# Patient Record
Sex: Female | Born: 1970 | Race: White | Hispanic: No | Marital: Married | Smoking: Never smoker
Health system: Southern US, Community
[De-identification: ages and names within clinical notes are randomized; demographics above are authoritative.]

## PROBLEM LIST (undated history)

## (undated) DIAGNOSIS — R011 Cardiac murmur, unspecified: Secondary | ICD-10-CM

## (undated) DIAGNOSIS — K219 Gastro-esophageal reflux disease without esophagitis: Secondary | ICD-10-CM

## (undated) DIAGNOSIS — F419 Anxiety disorder, unspecified: Secondary | ICD-10-CM

## (undated) DIAGNOSIS — I493 Ventricular premature depolarization: Secondary | ICD-10-CM

## (undated) DIAGNOSIS — E785 Hyperlipidemia, unspecified: Secondary | ICD-10-CM

## (undated) DIAGNOSIS — K635 Polyp of colon: Secondary | ICD-10-CM

## (undated) HISTORY — DX: Anxiety disorder, unspecified: F41.9

## (undated) HISTORY — DX: Ventricular premature depolarization: I49.3

## (undated) HISTORY — DX: Hyperlipidemia, unspecified: E78.5

## (undated) HISTORY — DX: Cardiac murmur, unspecified: R01.1

## (undated) HISTORY — PX: DILATION AND CURETTAGE OF UTERUS: SHX78

## (undated) HISTORY — DX: Gastro-esophageal reflux disease without esophagitis: K21.9

## (undated) HISTORY — DX: Polyp of colon: K63.5

---

## 1999-12-08 ENCOUNTER — Other Ambulatory Visit: Admission: RE | Admit: 1999-12-08 | Discharge: 1999-12-08 | Payer: Self-pay | Admitting: Gynecology

## 2001-01-06 ENCOUNTER — Other Ambulatory Visit: Admission: RE | Admit: 2001-01-06 | Discharge: 2001-01-06 | Payer: Self-pay | Admitting: Gynecology

## 2002-03-07 ENCOUNTER — Other Ambulatory Visit: Admission: RE | Admit: 2002-03-07 | Discharge: 2002-03-07 | Payer: Self-pay | Admitting: Gynecology

## 2002-03-14 ENCOUNTER — Ambulatory Visit (HOSPITAL_COMMUNITY): Admission: RE | Admit: 2002-03-14 | Discharge: 2002-03-14 | Payer: Self-pay | Admitting: Gynecology

## 2002-03-14 ENCOUNTER — Encounter (INDEPENDENT_AMBULATORY_CARE_PROVIDER_SITE_OTHER): Payer: Self-pay

## 2002-06-15 ENCOUNTER — Ambulatory Visit (HOSPITAL_COMMUNITY): Admission: RE | Admit: 2002-06-15 | Discharge: 2002-06-15 | Payer: Self-pay | Admitting: *Deleted

## 2002-06-15 ENCOUNTER — Encounter (INDEPENDENT_AMBULATORY_CARE_PROVIDER_SITE_OTHER): Payer: Self-pay

## 2003-03-01 ENCOUNTER — Inpatient Hospital Stay (HOSPITAL_COMMUNITY): Admission: AD | Admit: 2003-03-01 | Discharge: 2003-03-01 | Payer: Self-pay | Admitting: Gynecology

## 2003-03-01 ENCOUNTER — Encounter: Payer: Self-pay | Admitting: Gynecology

## 2003-03-13 ENCOUNTER — Other Ambulatory Visit: Admission: RE | Admit: 2003-03-13 | Discharge: 2003-03-13 | Payer: Self-pay | Admitting: Gynecology

## 2003-09-12 ENCOUNTER — Inpatient Hospital Stay (HOSPITAL_COMMUNITY): Admission: AD | Admit: 2003-09-12 | Discharge: 2003-09-16 | Payer: Self-pay | Admitting: Gynecology

## 2003-09-14 ENCOUNTER — Encounter (INDEPENDENT_AMBULATORY_CARE_PROVIDER_SITE_OTHER): Payer: Self-pay | Admitting: Specialist

## 2003-10-23 ENCOUNTER — Other Ambulatory Visit: Admission: RE | Admit: 2003-10-23 | Discharge: 2003-10-23 | Payer: Self-pay | Admitting: Gynecology

## 2005-01-26 ENCOUNTER — Other Ambulatory Visit: Admission: RE | Admit: 2005-01-26 | Discharge: 2005-01-26 | Payer: Self-pay | Admitting: Gynecology

## 2005-05-06 ENCOUNTER — Encounter: Admission: RE | Admit: 2005-05-06 | Discharge: 2005-05-06 | Payer: Self-pay | Admitting: Gynecology

## 2006-02-18 ENCOUNTER — Other Ambulatory Visit: Admission: RE | Admit: 2006-02-18 | Discharge: 2006-02-18 | Payer: Self-pay | Admitting: Gynecology

## 2007-08-24 ENCOUNTER — Inpatient Hospital Stay (HOSPITAL_COMMUNITY): Admission: AD | Admit: 2007-08-24 | Discharge: 2007-08-28 | Payer: Self-pay | Admitting: Obstetrics

## 2009-12-19 ENCOUNTER — Emergency Department (HOSPITAL_COMMUNITY): Admission: EM | Admit: 2009-12-19 | Discharge: 2009-12-19 | Payer: Self-pay | Admitting: Emergency Medicine

## 2011-04-09 NOTE — Op Note (Signed)
[facility]  Patient:    Patricia Hutchinson, Patricia Hutchinson Visit Number: [id number] MRN: [id number]          Service Type: DSU Location: [facility] Attending Physician:  Douglass Rivers Proc. Date: 06/15/02 Admit Date:  06/15/2002                             Operative Report  PREOPERATIVE DIAGNOSIS:       Inappropriately decrease of Beta HCG                               after spontaneous abortion, questionable                               retained products.  POSTOPERATIVE DIAGNOSIS:      Inappropriately decrease of Beta HCG                               after spontaneous abortion, questionable                               retained products.  OPERATION:                    D&C.  SURGEON:                      Douglass Rivers, M.D.  ASSISTANT:  ANESTHESIA:                   IV sedation with paracervical block.  ESTIMATED BLOOD LOSS:         Minimal.  FINDINGS:                     Uterus anteverted 8 cm, scan tissue with clot.  INDICATIONS:                  The patient is a 40 year old G2, P0 with brisk vaginal bleeding the day after menses.  Beta HCG was elevated at 36. Follow-up Beta HCGs failed to demonstrate return to 0 despite bleeding episode x3.  She is O positive.  COMPLICATIONS:                None.  PATHOLOGY:                    Uterine contents.  DESCRIPTION OF PROCEDURE:     The patient was taken to the operating room. IV sedation was induced.  The patient was placed in the dorsal lithotomy position, prepped and draped in the usual sterile fashion.  A bivalve speculum was placed in the vagina. The cervix was visualized, stabilized with single-tooth tenaculum and a paracervical block was placed with 10 cc of 0.25% Marcaine solution.  The uterus was sounded to 8.  It was gently dilated up to 23 [place] after which #7 suction and curette was advanced through the cervix toward the fundus.  Uterine contents were cleared.  A gentle curettage confirmed good  cry throughout including across the fundal region.  Instruments were removed.  The cervix was noted to be hemostatic.  The patient was given a shot of Methergine intraoperatively and transferred to the PACU in stable condition. Attending Physician:  Douglass Rivers DD:  06/15/02 TD:  06/18/02 Job: [id number]

## 2011-04-09 NOTE — Discharge Summary (Signed)
NAME:  Patricia Hutchinson, RALPHS                          ACCOUNT NO.:  [id number]   MEDICAL RECORD NO.:  [id number]                   PATIENT TYPE:  INP   LOCATION:  9128                                 FACILITY:  [facility]   PHYSICIAN:  Ivor Costa. Farrel Gobble, M.D.              DATE OF BIRTH:  [birth year]   DATE OF ADMISSION:  09/12/2003  DATE OF DISCHARGE:  09/16/2003                                 DISCHARGE SUMMARY   DISCHARGE DIAGNOSES:  1. Intrauterine pregnancy 41 weeks, delivered.  2. Polyhydramnios.  3. Post dates.  4. Status post forceps assisted vaginal delivery.  5. Mild shoulder dystocia.   HISTORY:  This is a 40 year old female gravida 3, para 0, aborta 2 with an  EDC of September 03, 2003.  Prenatal course complicated by ________ abortion;  therefore, the patient was on baby aspirin.  Also had a small hematoma of  the cervix which resolved.  On September 12, 2003 patient was admitted at 41  weeks for induction secondary to polyhydramnios and post dates.  The patient  was given Cervidil and Pitocin was begun on September 14, 2003.  On the p.m.  of September 13, 2003 patient's Pitocin was stopped, given dinner, repeat  Cervidil and Pitocin was begun on the following day on September 14, 2003.  Subsequently, on September 14, 2003 the patient underwent a forceps-assisted  vaginal delivery of a female, Apgars of 8 and 9, weight of 8 pounds 9 ounces.  There was a midline episiotomy which was repaired.  There was mild shoulder  dystocia which was corrected suprapubic with McRoberts in a single maneuver.  There was a medial lateral left laceration which was repaired.  The patient  was treated prophylactically with Cytotec for bleeding.  Postpartum, the  patient remained afebrile, voiding, stable condition.  She was discharged to  home in satisfactory condition on September 16, 2003.   ACCESSORY CLINICAL FINDINGS:  Laboratories:  On September 15, 2003 hemoglobin  was 11.8.  The patient is O+, rubella immune.   DISPOSITION:  The patient is discharged to home.  Return to office six  weeks.  Given prescription for Tylox p.r.n. pain #30.     Susa Loffler, P.A.                    Ivor Costa. Farrel Gobble, M.D.    TSG/MEDQ  D:  10/14/2003  T:  10/14/2003  Job:  [id number]

## 2011-04-09 NOTE — Op Note (Signed)
[facility]  Patient:    Patricia Hutchinson, Patricia Hutchinson Visit Number: [id number] MRN: [id number]          Service Type: DSU Location: [facility] Attending Physician:  Douglass Rivers Dictated by:   Douglass Rivers, M.D. Proc. Date: 03/14/02 Admit Date:  03/14/2002                             Operative Report  PREOPERATIVE DIAGNOSES:       Missed abortion at 8+ weeks.  POSTOPERATIVE DIAGNOSES:      Missed abortion at 8+ weeks.  PROCEDURE:                    First trimester dilatation and evacuation.  SURGEON:                      Douglass Rivers, M.D.  ANESTHESIA:                   MAC and a paracervical block.  FINDINGS:                     Anteverted uterus slightly enlarged.  PATHOLOGY:                    Uterine contents.  PROCEDURE:                    Patient was taken to the operating room and placed in the dorsal lithotomy position.  After IV sedation prepped and draped in the usual sterile fashion.  Bivalve speculum was placed in the vagina.  A bimanual examination was performed.  Orientation of the uterus confirmed. Bivalve speculum was placed in the vagina and cervix was visualized, stabilized with a single tooth tenaculum and a paracervical block was placed with 10 cc of 0.25% Marcaine solution after which the cervix was dilated gently to 25 [place] and a #8 suction curette was advanced through the cervix. The uterine contents were cleared with suction.  A gentle curetting confirmed good uterine cry throughout.  The instruments were removed from the vagina. The uterus was massaged.  The patient received a single shot of Methergine and was noted to be hemostatic.  She was extubated in the OR and transferred to the PACU in stable condition. Dictated by:   Douglass Rivers, M.D. Attending Physician:  Douglass Rivers DD:  03/14/02 TD:  03/14/02 Job: [id number]

## 2011-09-02 LAB — CBC
HCT: 36.4
Hemoglobin: 12.3
MCHC: 33.4
MCHC: 33.8
MCV: 81.8
Platelets: 180
RDW: 14.1 — ABNORMAL HIGH
RDW: 14.4 — ABNORMAL HIGH

## 2011-09-02 LAB — RPR: RPR Ser Ql: NONREACTIVE

## 2012-12-12 DIAGNOSIS — E559 Vitamin D deficiency, unspecified: Secondary | ICD-10-CM | POA: Insufficient documentation

## 2012-12-13 DIAGNOSIS — F411 Generalized anxiety disorder: Secondary | ICD-10-CM | POA: Insufficient documentation

## 2012-12-18 ENCOUNTER — Other Ambulatory Visit: Payer: Self-pay | Admitting: Family Medicine

## 2012-12-18 DIAGNOSIS — Z1231 Encounter for screening mammogram for malignant neoplasm of breast: Secondary | ICD-10-CM

## 2013-01-18 ENCOUNTER — Ambulatory Visit
Admission: RE | Admit: 2013-01-18 | Discharge: 2013-01-18 | Disposition: A | Discharge status: Home / Self Care | Payer: Private Health Insurance - Indemnity | Source: Ambulatory Visit | Attending: Family Medicine | Admitting: Family Medicine

## 2013-01-18 DIAGNOSIS — Z87898 Personal history of other specified conditions: Secondary | ICD-10-CM | POA: Insufficient documentation

## 2013-01-18 DIAGNOSIS — Z1231 Encounter for screening mammogram for malignant neoplasm of breast: Secondary | ICD-10-CM

## 2013-01-23 ENCOUNTER — Other Ambulatory Visit: Payer: Self-pay | Admitting: Family Medicine

## 2013-01-23 DIAGNOSIS — R928 Other abnormal and inconclusive findings on diagnostic imaging of breast: Secondary | ICD-10-CM

## 2013-02-01 ENCOUNTER — Ambulatory Visit
Admission: RE | Admit: 2013-02-01 | Discharge: 2013-02-01 | Disposition: A | Discharge status: Home / Self Care | Payer: Medicare HMO | Source: Ambulatory Visit | Attending: Family Medicine | Admitting: Family Medicine

## 2013-02-01 DIAGNOSIS — R928 Other abnormal and inconclusive findings on diagnostic imaging of breast: Secondary | ICD-10-CM

## 2013-07-27 ENCOUNTER — Other Ambulatory Visit: Payer: Self-pay | Admitting: Family Medicine

## 2013-07-27 DIAGNOSIS — R921 Mammographic calcification found on diagnostic imaging of breast: Secondary | ICD-10-CM

## 2013-08-16 ENCOUNTER — Ambulatory Visit
Admission: RE | Admit: 2013-08-16 | Discharge: 2013-08-16 | Disposition: A | Discharge status: Home / Self Care | Payer: BC Managed Care – PPO | Source: Ambulatory Visit | Attending: Family Medicine | Admitting: Family Medicine

## 2013-08-16 DIAGNOSIS — R921 Mammographic calcification found on diagnostic imaging of breast: Secondary | ICD-10-CM

## 2014-01-24 ENCOUNTER — Other Ambulatory Visit: Payer: Self-pay | Admitting: Family Medicine

## 2014-01-24 DIAGNOSIS — R921 Mammographic calcification found on diagnostic imaging of breast: Secondary | ICD-10-CM

## 2014-02-07 ENCOUNTER — Ambulatory Visit
Admission: RE | Admit: 2014-02-07 | Discharge: 2014-02-07 | Disposition: A | Discharge status: Home / Self Care | Payer: BC Managed Care – PPO | Source: Ambulatory Visit | Attending: Family Medicine | Admitting: Family Medicine

## 2014-02-07 DIAGNOSIS — R921 Mammographic calcification found on diagnostic imaging of breast: Secondary | ICD-10-CM

## 2014-11-26 DIAGNOSIS — K219 Gastro-esophageal reflux disease without esophagitis: Secondary | ICD-10-CM | POA: Insufficient documentation

## 2014-12-26 ENCOUNTER — Other Ambulatory Visit (INDEPENDENT_AMBULATORY_CARE_PROVIDER_SITE_OTHER): Payer: 59

## 2014-12-26 ENCOUNTER — Ambulatory Visit (INDEPENDENT_AMBULATORY_CARE_PROVIDER_SITE_OTHER): Payer: 59 | Admitting: Gastroenterology

## 2014-12-26 ENCOUNTER — Encounter: Payer: Self-pay | Admitting: Gastroenterology

## 2014-12-26 VITALS — BP 100/60 | HR 72 | Ht 63.5 in | Wt 138.8 lb

## 2014-12-26 DIAGNOSIS — R140 Abdominal distension (gaseous): Secondary | ICD-10-CM

## 2014-12-26 DIAGNOSIS — R110 Nausea: Secondary | ICD-10-CM

## 2014-12-26 DIAGNOSIS — R194 Change in bowel habit: Secondary | ICD-10-CM

## 2014-12-26 DIAGNOSIS — R6881 Early satiety: Secondary | ICD-10-CM | POA: Insufficient documentation

## 2014-12-26 LAB — COMPREHENSIVE METABOLIC PANEL
ALT: 9 U/L (ref 0–35)
AST: 12 U/L (ref 0–37)
Albumin: 4.6 g/dL (ref 3.5–5.2)
Alkaline Phosphatase: 40 U/L (ref 39–117)
BILIRUBIN TOTAL: 0.4 mg/dL (ref 0.2–1.2)
BUN: 7 mg/dL (ref 6–23)
CHLORIDE: 103 meq/L (ref 96–112)
CO2: 28 mEq/L (ref 19–32)
Calcium: 10 mg/dL (ref 8.4–10.5)
Creatinine, Ser: 0.76 mg/dL (ref 0.40–1.20)
GFR: 88.72 mL/min (ref >60.00)
GLUCOSE: 95 mg/dL (ref 70–99)
POTASSIUM: 4.5 meq/L (ref 3.5–5.1)
Sodium: 139 mEq/L (ref 135–145)
Total Protein: 7.4 g/dL (ref 6.0–8.3)

## 2014-12-26 LAB — CBC WITH DIFFERENTIAL/PLATELET
BASOS PCT: 0.4 % (ref 0.0–3.0)
Basophils Absolute: 0 10*3/uL (ref 0.0–0.1)
EOS PCT: 0.9 % (ref 0.0–5.0)
Eosinophils Absolute: 0 10*3/uL (ref 0.0–0.7)
HEMATOCRIT: 40.8 % (ref 36.0–46.0)
HEMOGLOBIN: 13.6 g/dL (ref 12.0–15.0)
LYMPHS ABS: 1 10*3/uL (ref 0.7–4.0)
LYMPHS PCT: 25.1 % (ref 12.0–46.0)
MCHC: 33.2 g/dL (ref 30.0–36.0)
MCV: 78.6 fl (ref 78.0–100.0)
MONO ABS: 0.4 10*3/uL (ref 0.1–1.0)
Monocytes Relative: 9.3 % (ref 3.0–12.0)
Neutro Abs: 2.7 10*3/uL (ref 1.4–7.7)
Neutrophils Relative %: 64.3 % (ref 43.0–77.0)
Platelets: 177 10*3/uL (ref 150.0–400.0)
RBC: 5.2 Mil/uL — ABNORMAL HIGH (ref 3.87–5.11)
RDW: 13.3 % (ref 11.5–15.5)
WBC: 4.2 10*3/uL (ref 4.0–10.5)

## 2014-12-26 LAB — IGA: IgA: 315 mg/dL (ref 68–378)

## 2014-12-26 LAB — TSH: TSH: 0.68 u[IU]/mL (ref 0.35–4.50)

## 2014-12-26 MED ORDER — MOVIPREP 100 G PO SOLR: 1.0000 | Freq: Once | ORAL | Status: DC

## 2014-12-26 NOTE — Progress Notes (Signed)
12/26/2014 Patricia Hutchinson [id number] Aug 18, 1971   HISTORY OF PRESENT ILLNESS:  This is a very pleasant 44 year old female who is new to our practice and was referred her by her PCP's office, Dr. Coletta Memos.  She comes in today with several GI complaints.  She says that all of her symptoms started about 7 weeks ago and prior to that she could eat anything without any problems.  Now she complains of constantly feeling bloated, constant belching, and feel full very quickly when eating.  She also complains of a change in bowel habits with loose stools; says that she has not had a solid bowel movement in the past 7 weeks.  Describes her stools as looking like "sand" and "oatmeal".  Has 1-2 BM's per day.  Denies rectal bleeding.  Has lower abdominal tenderness and "tinges" of pain that come and go, but seem worse when she is walking, etc.  She has not undergone any evaluation by her PCP except for Hpylori serologies, which were negative.  She also recently had an ECHO, which was normal, because she was having PVC's which were symptomatic and then triggering her anxiety and possibly worsening some of her GI symptoms including the belching.  She has tried several different OTC regimens for her symptoms including Phazyme, Gas-X, OTC Nexium, OTC prilosec, fiber capsules, digestive enzymes, and 7 day "high potency" probiotics with 200 billion bacteria (just of note, she said that she has a friend who is really into probiotics and she does not trust any that do not have to be refrigerated).  She lost her son 5 years ago and since then she has dealt with a lot of anxiety and panic attacks.  She takes clonazepam prn, but does not like to use it unless absolutely necessary.  She says that she has never experienced GI issues with her anxiety in the past and prior to 7 weeks ago she did not have any GI issues.   Past Medical History  Diagnosis Date  . Anxiety   . PVCs (premature ventricular contractions)   . GERD  (gastroesophageal reflux disease)   . Hyperlipidemia 34    borderline  . Heart murmur    Past Surgical History  Procedure Laterality Date  . Dilation and curettage of uterus      x 2    reports that she has never smoked. She has never used smokeless tobacco. She reports that she drinks alcohol. She reports that she does not use illicit drugs. family history includes Breast cancer in her maternal grandmother and another family member; Colon polyps in her father; Diabetes in her father. There is no history of Colon cancer. No Known Allergies    Outpatient Encounter Prescriptions as of 12/26/2014  Medication Sig  . clonazePAM (KLONOPIN) 0.5 MG tablet Take 0.5 mg by mouth as needed for anxiety.  Marland Kitchen OVER THE COUNTER MEDICATION Psyllium fiber laxative capsules 2-4 daily  . OVER THE COUNTER MEDICATION Essential enzymes- one a day  . Probiotic Product (PROBIOTIC DAILY PO) Take by mouth. Ultimate flora probiotic , 7 day treatment , one a day  . VITAMIN D, CHOLECALCIFEROL, PO Take by mouth. 1 drop daily , 2000iu  . MOVIPREP 100 G SOLR Take 1 kit (200 g total) by mouth once.     REVIEW OF SYSTEMS  : All other systems reviewed and negative except where noted in the History of Present Illness.   PHYSICAL EXAM: BP 100/60 mmHg  Pulse 72  Ht 5'  3.5" (1.613 m)  Wt 138 lb 12.8 oz (62.959 kg)  BMI 24.20 kg/m2  LMP 12/16/2014 General: Well developed white female in no acute distress; somewhat anxious Head: Normocephalic and atraumatic Eyes:  Sclerae anicteric, conjunctiva pink. Ears: Normal auditory acuity Lungs: Clear throughout to auscultation Heart: Regular rate and rhythm Abdomen: Soft, non-distended.  Somewhat hyperactive bowel sounds.  Mild diffuse TTP. Musculoskeletal: Symmetrical with no gross deformities  Skin: No lesions on visible extremities Extremities: No edema  Neurological: Alert oriented x 4, grossly non-focal Psychological:  Alert and cooperative. Normal mood and affect.   Somewhat anxious.  ASSESSMENT AND PLAN: -44 year old female with multiple GI complaints including bloating, belching, nausea, early satiety, abdominal pains, and change in bowel habits with loose stools.  She does have underlying anxiety with occasional panic attacks, which do not sound like they are the best controlled.  I would not be surprised if her GI evaluation is all negative and she has IBS/aerophagia, but she says that she's never had GI issues in the past with her anxiety.  Will proceed with some evaluation including labs:  CBC, CMP, TSH, and celiac labs.  We will schedule EGD and colonoscopy as well to rule out any other pathologic causes of her multiple complaints.  Gallbladder issues could also be a consideration.  The risks, benefits, and alternatives were discussed with the patient and she consents to proceed.  She needs to discuss with her PCP a different regimen/treatment plan for her anxiety.  She can continue taking probiotics and I have recommended either Florastor or Restora for now (could consider VSL, however).  She can continue fiber supplements daily and Gas-X or Phayzyme prn.  She has already failed and as been unsuccessful with some IBS treatments so we will await results of the above studies for now before attempting other regimens.

## 2014-12-26 NOTE — Patient Instructions (Addendum)
You have been scheduled for an endoscopy and colonoscopy. Please follow the written instructions given to you at your visit today. Please pick up your prep at the pharmacy within the next 1-3 days. If you use inhalers (even only as needed), please bring them with you on the day of your procedure. Your physician has requested that you go to www.startemmi.com and enter the access code given to you at your visit today. This web site gives a general overview about your procedure. However, you should still follow specific instructions given to you by our office regarding your preparation for the procedure.  Go to the basement for labs today  CC:  Bernerd Limbo MD

## 2014-12-27 LAB — TISSUE TRANSGLUTAMINASE, IGA: TISSUE TRANSGLUTAMINASE AB, IGA: 1 U/mL (ref <4)

## 2014-12-27 NOTE — Progress Notes (Signed)
Agree with initial assessment and plans 

## 2015-01-07 ENCOUNTER — Other Ambulatory Visit: Payer: Self-pay | Admitting: *Deleted

## 2015-01-13 ENCOUNTER — Ambulatory Visit (AMBULATORY_SURGERY_CENTER): Payer: 59 | Admitting: Internal Medicine

## 2015-01-13 ENCOUNTER — Encounter: Payer: Self-pay | Admitting: Internal Medicine

## 2015-01-13 VITALS — BP 119/69 | HR 81 | Temp 99.1°F | Resp 15 | Ht 63.0 in | Wt 138.0 lb

## 2015-01-13 DIAGNOSIS — D123 Benign neoplasm of transverse colon: Secondary | ICD-10-CM | POA: Diagnosis not present

## 2015-01-13 DIAGNOSIS — R1010 Upper abdominal pain, unspecified: Secondary | ICD-10-CM

## 2015-01-13 DIAGNOSIS — R194 Change in bowel habit: Secondary | ICD-10-CM | POA: Diagnosis not present

## 2015-01-13 DIAGNOSIS — K3189 Other diseases of stomach and duodenum: Secondary | ICD-10-CM | POA: Diagnosis not present

## 2015-01-13 DIAGNOSIS — R140 Abdominal distension (gaseous): Secondary | ICD-10-CM

## 2015-01-13 DIAGNOSIS — R6881 Early satiety: Secondary | ICD-10-CM

## 2015-01-13 DIAGNOSIS — K2950 Unspecified chronic gastritis without bleeding: Secondary | ICD-10-CM | POA: Diagnosis not present

## 2015-01-13 DIAGNOSIS — R110 Nausea: Secondary | ICD-10-CM | POA: Diagnosis not present

## 2015-01-13 MED ORDER — SODIUM CHLORIDE 0.9 % IV SOLN: 500.0000 mL | INTRAVENOUS | Status: DC

## 2015-01-13 NOTE — Patient Instructions (Addendum)
YOU HAD AN ENDOSCOPIC PROCEDURE TODAY AT THE [facility]: Refer to the procedure report that was given to you for any specific questions about what was found during the examination.  If the procedure report does not answer your questions, please call your gastroenterologist to clarify.  If you requested that your care partner not be given the details of your procedure findings, then the procedure report has been included in a sealed envelope for you to review at your convenience later.  YOU SHOULD EXPECT: Some feelings of bloating in the abdomen. Passage of more gas than usual.  Walking can help get rid of the air that was put into your GI tract during the procedure and reduce the bloating. If you had a lower endoscopy (such as a colonoscopy or flexible sigmoidoscopy) you may notice spotting of blood in your stool or on the toilet paper. If you underwent a bowel prep for your procedure, then you may not have a normal bowel movement for a few days.  DIET: Your first meal following the procedure should be a light meal and then it is ok to progress to your normal diet.  A half-sandwich or bowl of soup is an example of a good first meal.  Heavy or fried foods are harder to digest and may make you feel nauseous or bloated.  Likewise meals heavy in dairy and vegetables can cause extra gas to form and this can also increase the bloating.  Drink plenty of fluids but you should avoid alcoholic beverages for 24 hours.  ACTIVITY: Your care partner should take you home directly after the procedure.  You should plan to take it easy, moving slowly for the rest of the day.  You can resume normal activity the day after the procedure however you should NOT DRIVE or use heavy machinery for 24 hours (because of the sedation medicines used during the test).    SYMPTOMS TO REPORT IMMEDIATELY: A gastroenterologist can be reached at any hour.  During normal business hours, 8:30 AM to 5:00 PM Monday through Friday,  call [phone].  After hours and on weekends, please call the GI answering service at [phone] who will take a message and have the physician on call contact you.   Following lower endoscopy (colonoscopy or flexible sigmoidoscopy):  Excessive amounts of blood in the stool  Significant tenderness or worsening of abdominal pains  Swelling of the abdomen that is new, acute  Fever of 100F or higher  Following upper endoscopy (EGD)  Vomiting of blood or coffee ground material  New chest pain or pain under the shoulder blades  Painful or persistently difficult swallowing  New shortness of breath  Fever of 100F or higher  Black, tarry-looking stools  FOLLOW UP: If any biopsies were taken you will be contacted by phone or by letter within the next 1-3 weeks.  Call your gastroenterologist if you have not heard about the biopsies in 3 weeks.  Our staff will call the home number listed on your records the next business day following your procedure to check on you and address any questions or concerns that you may have at that time regarding the information given to you following your procedure. This is a courtesy call and so if there is no answer at the home number and we have not heard from you through the emergency physician on call, we will assume that you have returned to your regular daily activities without incident.  SIGNATURES/CONFIDENTIALITY: You and/or your care   partner have signed paperwork which will be entered into your electronic medical record.  These signatures attest to the fact that that the information above on your After Visit Summary has been reviewed and is understood.  Full responsibility of the confidentiality of this discharge information lies with you and/or your care-partner.  Polyp-handout given  prilosec 20 mg over the counter for the next 2 weeks.  Call office in the next 2-3 days to schedule appointment with Dr Henrene Pastor for about 6 weeks.

## 2015-01-13 NOTE — Progress Notes (Signed)
Report to PACU, RN, vss, BBS= Clear.  

## 2015-01-13 NOTE — Op Note (Signed)
[place]  [address] [place], [address]   ENDOSCOPY PROCEDURE REPORT  PATIENT: Patricia Hutchinson, Patricia Hutchinson  MR#: [id number] BIRTHDATE: 28-Apr-1971 , 44  yrs. old GENDER: female ENDOSCOPIST: Eustace Quail, MD REFERRED BY:  Bernerd Limbo, M.D. PROCEDURE DATE:  01/13/2015 PROCEDURE:  EGD w/ biopsy ASA CLASS:     Class I INDICATIONS:  abdominal pain/bloating, or satiety, weight loss. MEDICATIONS: Monitored anesthesia care and Propofol 130 mg IV TOPICAL ANESTHETIC: none  DESCRIPTION OF PROCEDURE: After the risks benefits and alternatives of the procedure were thoroughly explained, informed consent was obtained.  The LB [id number] endoscope was introduced through the mouth and advanced to the second portion of the duodenum , Without limitations.  The instrument was slowly withdrawn as the mucosa was fully examined.    The esophagus was normal throughout.  Just below the mucosal Z line was a 1 cm inflammatory appearing nodule which was biopsied.. Stomach was otherwise normal.  The duodenum was normal. Retroflexed views revealed no abnormalities.     The scope was then withdrawn from the patient and the procedure completed.  COMPLICATIONS: There were no immediate complications.  ENDOSCOPIC IMPRESSION: 1. Small benign inflammatory appearing nodule at the gastroesophageal junction status post biopsies 2. Otherwise normal EGD 3. 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 Has had some improvement since office visit  RECOMMENDATIONS: 1.  Prilosec OTC 20 mg daily for 2 weeks 2.  Await biopsy results. Dr. Henrene Pastor will send you a letter in a few weeks 3.  Okay to liberalize diet as we discussed 4. Call office next 2-3 days to schedule an office appointment, with Dr. Henrene Pastor, for about 6 weeks  REPEAT EXAM:  eSigned:  Eustace Quail, MD 01/13/2015 4:25 PM    BU:LAGTX Coletta Memos, MD and The Patient

## 2015-01-13 NOTE — Op Note (Signed)
[place]  [address] [place], [address]   COLONOSCOPY PROCEDURE REPORT  PATIENT: Patricia, Hutchinson  MR#: [id number] BIRTHDATE: 10/11/1971 , 44  yrs. old GENDER: female ENDOSCOPIST: Eustace Quail, MD REFERRED OE:HOZYY Bouska, M.D. PROCEDURE DATE:  01/13/2015 PROCEDURE:   Colonoscopy with snare polypectomy x 1 First Screening Colonoscopy - Avg.  risk and is 50 yrs.  old or older - No.  Prior Negative Screening - Now for repeat screening. N/A  History of Adenoma - Now for follow-up colonoscopy & has been > or = to 3 yrs.  N/A  Polyps Removed Today? Yes. ASA CLASS:   Class I INDICATIONS:change in bowel habits and abdominal pain / bloating. Weight loss. MEDICATIONS: Monitored anesthesia care and Propofol 350 mg IV  DESCRIPTION OF PROCEDURE:   After the risks benefits and alternatives of the procedure were thoroughly explained, informed consent was obtained.  The digital rectal exam revealed no abnormalities of the rectum.   The LB [id number]  endoscope was introduced through the anus and advanced to the cecum, which was identified by both the appendix and ileocecal valve. No adverse events experienced.   The quality of the prep was excellent, using MoviPrep  The instrument was then slowly withdrawn as the colon was fully examined.    COLON FINDINGS: The examined terminal ileum appeared to be normal. A sessile polyp measuring 5 mm in size was found in the transverse colon.  A polypectomy was performed with a cold snare.  The resection was complete, the polyp tissue was completely retrieved and sent to histology.   The examination was otherwise normal. Retroflexed views revealed no abnormalities. The time to cecum=3 minutes 07 seconds.  Withdrawal time=9 minutes 25 seconds.  The scope was withdrawn and the procedure completed. COMPLICATIONS: There were no immediate complications.  ENDOSCOPIC IMPRESSION: 1.   The examined terminal ileum appeared to be  normal 2.   Sessile polyp measuring 5 mm in size was found in the transverse colon; polypectomy was performed with a cold snare 3.   The examination was otherwise normal  RECOMMENDATIONS: 1.  Repeat colonoscopy in 5 years if polyp adenomatous; otherwise 10 years 2.  Upper endoscopy performed today (please see results)  eSigned:  Eustace Quail, MD 01/13/2015 4:17 PM   cc: Bernerd Limbo, MD and The Patient

## 2015-01-13 NOTE — Progress Notes (Signed)
Called to room to assist during endoscopic procedure.  Patient ID and intended procedure confirmed with present staff. Received instructions for my participation in the procedure from the performing physician.  

## 2015-01-14 ENCOUNTER — Telehealth: Payer: Self-pay

## 2015-01-14 NOTE — Telephone Encounter (Signed)
  Follow up Call-  Call back number 01/13/2015  Post procedure Call Back phone  # [phone]  Permission to leave phone message Yes     Patient questions:  Do you have a fever, pain , or abdominal swelling? No. Pain Score  0 *  Have you tolerated food without any problems? Yes.    Have you been able to return to your normal activities? Yes.    Do you have any questions about your discharge instructions: Diet   No. Medications  No. Follow up visit  No.  Do you have questions or concerns about your Care? No.  Actions: * If pain score is 4 or above: No action needed, pain <4.

## 2015-01-17 ENCOUNTER — Other Ambulatory Visit: Payer: Self-pay | Admitting: Family

## 2015-01-17 DIAGNOSIS — R921 Mammographic calcification found on diagnostic imaging of breast: Secondary | ICD-10-CM

## 2015-01-20 ENCOUNTER — Telehealth: Payer: Self-pay | Admitting: Internal Medicine

## 2015-01-20 NOTE — Telephone Encounter (Signed)
Pt states she cannot take Prilosec, states that it made her feel jittery. Pt states she doesn't think she can take PPI's. Discussed with pt that she might want to try zantac and see if she can take that. Pt will call back if she has problems with the zantac.

## 2015-01-21 ENCOUNTER — Encounter: Payer: Self-pay | Admitting: Internal Medicine

## 2015-02-24 DIAGNOSIS — K635 Polyp of colon: Secondary | ICD-10-CM | POA: Insufficient documentation

## 2015-02-25 ENCOUNTER — Ambulatory Visit: Payer: 59 | Admitting: Internal Medicine

## 2015-02-25 ENCOUNTER — Ambulatory Visit
Admission: RE | Admit: 2015-02-25 | Discharge: 2015-02-25 | Disposition: A | Discharge status: Home / Self Care | Payer: 59 | Source: Ambulatory Visit | Attending: Family | Admitting: Family

## 2015-02-25 ENCOUNTER — Other Ambulatory Visit: Payer: Self-pay | Admitting: Family

## 2015-02-25 DIAGNOSIS — R921 Mammographic calcification found on diagnostic imaging of breast: Secondary | ICD-10-CM

## 2015-06-10 ENCOUNTER — Ambulatory Visit (INDEPENDENT_AMBULATORY_CARE_PROVIDER_SITE_OTHER): Payer: 59 | Admitting: Urgent Care

## 2015-06-10 VITALS — BP 120/70 | HR 88 | Temp 99.0°F | Resp 16 | Ht 64.0 in | Wt 148.2 lb

## 2015-06-10 DIAGNOSIS — I809 Phlebitis and thrombophlebitis of unspecified site: Secondary | ICD-10-CM

## 2015-06-10 DIAGNOSIS — R202 Paresthesia of skin: Secondary | ICD-10-CM | POA: Diagnosis not present

## 2015-06-10 DIAGNOSIS — R200 Anesthesia of skin: Secondary | ICD-10-CM | POA: Insufficient documentation

## 2015-06-10 NOTE — Patient Instructions (Signed)
Phlebitis Phlebitis is soreness and swelling (inflammation) of a vein. This can occur in your arms, legs, or torso (trunk), as well as deeper inside your body. Phlebitis is usually not serious when it occurs close to the surface of the body. However, it can cause serious problems when it occurs in a vein deeper inside the body. CAUSES  Phlebitis can be triggered by various things, including:   Reduced blood flow through your veins. This can happen with:  Bed rest over a long period.  Long-distance travel.  Injury.  Surgery.  Being overweight (obese) or pregnant.  Having an IV tube put in the vein and getting certain medicines through the vein.  Cancer and cancer treatment.  Use of illegal drugs taken through the vein.  Inflammatory diseases.  Inherited (genetic) diseases that increase the risk of blood clots.  Hormone therapy, such as birth control pills. SIGNS AND SYMPTOMS   Red, tender, swollen, and painful area on your skin. Usually, the area will be long and narrow.  Firmness along the center of the affected area. This can indicate that a blood clot has formed.  Low-grade fever. DIAGNOSIS  A health care provider can usually diagnose phlebitis by examining the affected area and asking about your symptoms. To check for infection or blood clots, your health care provider may order blood tests or an ultrasound exam of the area. Blood tests and your family history may also indicate if you have an underlying genetic disease that causes blood clots. Occasionally, a piece of tissue is taken from the body (biopsy sample) if an unusual cause of phlebitis is suspected. TREATMENT  Treatment will vary depending on the severity of the condition and the area of the body affected. Treatment may include:  Use of a warm compress or heating pad.  Use of compression stockings or bandages.  Anti-inflammatory medicines.  Removal of any IV tube that may be causing the problem.  Medicines  that kill germs (antibiotics) if an infection is present.  Blood-thinning medicines if a blood clot is suspected or present.  In rare cases, surgery may be needed to remove damaged sections of vein. HOME CARE INSTRUCTIONS   Only take over-the-counter or prescription medicines as directed by your health care provider. Take all medicines exactly as prescribed.  Raise (elevate) the affected area above the level of your heart as directed by your health care provider.  Apply a warm compress or heating pad to the affected area as directed by your health care provider. Do not sleep with the heating pad.  Use compression stockings or bandages as directed. These will speed healing and prevent the condition from coming back.  If you are on blood thinners:  Get follow-up blood tests as directed by your health care provider.  Check with your health care provider before using any new medicines.  Carry a medical alert card or wear your medical alert jewelry to show that you are on blood thinners.  For phlebitis in the legs:  Avoid prolonged standing or bed rest.  Keep your legs moving. Raise your legs when sitting or lying.  Do not smoke.  Women, particularly those over the age of 35, should consider the risks and benefits of taking the contraceptive pill. This kind of hormone treatment can increase your risk for blood clots.  Follow up with your health care provider as directed. SEEK MEDICAL CARE IF:   You have unusual bruising or any bleeding problems.  Your swelling or pain in the affected area   is not improving. °· You are on anti-inflammatory medicine, and you develop belly (abdominal) pain. °SEEK IMMEDIATE MEDICAL CARE IF:  °· You have a sudden onset of chest pain or difficulty breathing. °· You have a fever or persistent symptoms for more than 2-3 days. °· You have a fever and your symptoms suddenly get worse. °MAKE SURE YOU: °· Understand these instructions. °· Will watch your  condition. °· Will get help right away if you are not doing well or get worse. °Document Released: 11/02/2001 Document Revised: 08/29/2013 Document Reviewed: 07/16/2013 °Exit[employer]® Patient Information ©2015 [employer]. This information is not intended to replace advice given to you by your health care provider. Make sure you discuss any questions you have with your health care provider. ° °

## 2015-06-10 NOTE — Progress Notes (Signed)
    MRN: [id number] DOB: 04/22/1971  Subjective:   Patricia Hutchinson is a 44 y.o. female presenting for chief complaint of Leg Injury  Reports ~1 week history of numbness and tingling of her left leg. 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 Reports that she randomly felt some numbness and tingling after she stood up and noticed that she also has some bruises of her left leg. Reports a history of breathing easily but cannot recall any specific trauma. Denies history of DVT, bleeding easily, bleeding gums. Denies history of clotting disorder. Denies fevers, lower leg swelling, redness, pain, weakness. Denies using oral contraception or smoking. Denies any recent surgeries, extended time traveling in the car or plane. No history of fracture of her lower leg. Denies any other aggravating or relieving factors, no other questions or concerns.  Patricia Hutchinson has a current medication list which includes the following prescription(s): clonazepam, probiotic product, cholecalciferol, OVER THE COUNTER MEDICATION, and OVER THE COUNTER MEDICATION. She has No Known Allergies.  Patricia Hutchinson  has a past medical history of Anxiety; PVCs (premature ventricular contractions); GERD (gastroesophageal reflux disease); Hyperlipidemia (34); Heart murmur; and Colon polyp. Also  has past surgical history that includes Dilation and curettage of uterus.  ROS As in subjective.  Objective:   Vitals: BP 120/70 mmHg  Pulse 88  Temp(Src) 99 F (37.2 C) (Oral)  Resp 16  Ht 5\' 4"  (1.626 m)  Wt 148 lb 3.2 oz (67.223 kg)  BMI 25.43 kg/m2  SpO2 98%  LMP 05/27/2015  Physical Exam  Constitutional: She is oriented to person, place, and time. She appears well-developed and well-nourished.  Cardiovascular: Normal rate, regular rhythm and intact distal pulses.  Exam reveals no gallop and no friction rub.   Murmur (low grade (II/VI) systolic murmur, NOT NEW) heard. Pulmonary/Chest: No respiratory distress. She has no wheezes.  She has no rales.  Musculoskeletal:       Left knee: She exhibits ecchymosis. She exhibits normal range of motion, no swelling, no effusion, no deformity, no laceration, no erythema and no bony tenderness. No tenderness found.       Legs: Full ROM, Strength 5/5. Negative Homans sign.  Neurological: She is alert and oriented to person, place, and time. She has normal reflexes.  Skin: Skin is warm and dry. No rash noted. No erythema. No pallor.   Assessment and Plan :   1. Thrombophlebitis 2. Numbness and tingling of left leg - Patient likely undergoing thrombophlebitis due to unperceived trauma. Numbness and tingling is very focal over areas of bruising likely related to thrombophlebitis. Low risk factors for DVT, advised patient take ibuprofen as needed. RTC in 4 weeks if numbness and tingling persists.  Jaynee Eagles, PA-C [facility] [phone] 06/10/2015 1:07 PM

## 2015-12-24 DIAGNOSIS — J312 Chronic pharyngitis: Secondary | ICD-10-CM | POA: Insufficient documentation

## 2016-02-04 ENCOUNTER — Other Ambulatory Visit: Payer: Self-pay

## 2016-02-04 DIAGNOSIS — Z1231 Encounter for screening mammogram for malignant neoplasm of breast: Secondary | ICD-10-CM

## 2016-02-20 ENCOUNTER — Ambulatory Visit (INDEPENDENT_AMBULATORY_CARE_PROVIDER_SITE_OTHER): Payer: Self-pay | Admitting: Physician Assistant

## 2016-02-20 VITALS — BP 112/76 | HR 81 | Temp 98.8°F | Resp 16 | Ht 63.0 in | Wt 139.8 lb

## 2016-02-20 DIAGNOSIS — J312 Chronic pharyngitis: Secondary | ICD-10-CM

## 2016-02-20 DIAGNOSIS — N907 Vulvar cyst: Secondary | ICD-10-CM

## 2016-02-20 NOTE — Progress Notes (Signed)
Patient ID: Patricia Hutchinson, female    DOB: 30-Jul-1971, 45 y.o.   MRN: [id number]  PCP: Smothers, Andree Elk, NP  Subjective:   Chief Complaint  Patient presents with  . Cyst    per patient on right laiba area x 6 week with pain and pinching     HPI Presents for evaluation of a lump in the RIGHT labia minora x 5-6 weeks.  Was seen at her PCP office about 4 weeks ago. The area was tender, swollen and red. She had some drainage from the area. Diagnosed as  "zit" of the labia minora and advised to use warm compresses.  Burning, itching, discharge are all improved, now "minimal."  Did an internet search and wants to make sure this isn't cancer.  In addition, she asks that I examine her throat, which has been sore for several months. She has seen her PCP, who prescribed allergy treatment, and then ENT when that initial treatment was ineffective. She is now on BID PPI therapy with QD H2 blocker x 1 month. She has modified her diet significantly to avoid "everything" (alcohol, spicy, acidic, fatty) and has had minimal improvement.    Review of Systems Nor urinary or bowel symptoms. No atypical vaginal discharge.    Patient Active Problem List   Diagnosis Date Noted  . Acute pharyngitis 12/24/2015  . Numbness and tingling of left leg 06/10/2015  . Colon polyp 02/24/2015  . Nausea 12/26/2014  . Early satiety 12/26/2014  . Bloating 12/26/2014  . Change in bowel habits 12/26/2014  . Gastro-esophageal reflux disease without esophagitis 11/26/2014  . Personal history of other specified conditions 01/18/2013  . Anxiety state 12/13/2012  . Avitaminosis D 12/12/2012     Prior to Admission medications   Medication Sig Start Date End Date Taking? Authorizing Provider  clonazePAM (KLONOPIN) 0.5 MG tablet Take 0.5 mg by mouth as needed for anxiety.   Yes Historical Provider, MD  fluticasone Asencion Islam) 50 MCG/ACT nasal spray  12/24/15  Yes Historical Provider, MD  omeprazole (PRILOSEC) 40 MG  capsule Take 40 mg by mouth. 01/29/16 02/28/16 Yes Historical Provider, MD  OVER THE COUNTER MEDICATION Psyllium fiber laxative capsules 2-4 daily   Yes Historical Provider, MD  [place] Essential enzymes- one a day   Yes Historical Provider, MD  Probiotic Product (PROBIOTIC DAILY PO) Take by mouth. Ultimate flora probiotic , 7 day treatment , one a day   Yes Historical Provider, MD  VITAMIN D, CHOLECALCIFEROL, PO Take by mouth. 1 drop daily , 2000iu   Yes Historical Provider, MD     No Known Allergies     Objective:  Physical Exam  Constitutional: She is oriented to person, place, and time. She appears well-developed and well-nourished. She is active and cooperative. No distress.  BP 112/76 mmHg  Pulse 81  Temp(Src) 98.8 F (37.1 C) (Oral)  Resp 16  Ht 5\' 3"  (1.6 m)  Wt 139 lb 12.8 oz (63.413 kg)  BMI 24.77 kg/m2  SpO2 98%  LMP 02/15/2016   HENT:  Head: Normocephalic and atraumatic.  Mouth/Throat: Uvula is midline, oropharynx is clear and moist and mucous membranes are normal. No oral lesions. Normal dentition. No uvula swelling.  Eyes: Conjunctivae are normal.  Pulmonary/Chest: Effort normal.  Genitourinary:    Pelvic exam was performed with patient supine. No labial fusion. There is lesion on the right labia. There is no rash, tenderness or injury on the right labia. There is no tenderness, lesion or  injury on the left labia.  Lymphadenopathy:       Right: No inguinal adenopathy present.       Left: No inguinal adenopathy present.  Neurological: She is alert and oriented to person, place, and time.  Psychiatric: Her speech is normal and behavior is normal. Her mood appears anxious. Her affect is not angry, not blunt, not labile and not inappropriate. She does not exhibit a depressed mood.           Assessment & Plan:   1. Labial cyst Reassured that this cyst is benign. Anticipatory guidance. May become inflamed again in the future and she can resume  warm compresses. If ineffective, may need oral antibiotics or I&D, but there is no need to excise it now.  2. Chronic sore throat Discussed pathophysiology of LPR and the need to stay on treatment longer for adequate results. However, encouraged her to follow-up with her ENT if her symptoms are not significantly improved after 3-4 months, as she may warrant additional evaluation.   Fara Chute, PA-C Physician Assistant-Certified [facility]

## 2016-03-09 ENCOUNTER — Ambulatory Visit: Payer: Self-pay

## 2016-03-12 ENCOUNTER — Telehealth: Payer: Self-pay | Admitting: Internal Medicine

## 2016-03-12 NOTE — Telephone Encounter (Signed)
Pt states she is having a lot of burning in her throat. States ENT doc told her she had silent reflux but she is still having issues. Pt requests appt with Dr. Henrene Pastor. Pt scheduled to see Dr. Henrene Pastor 04/20/16@2 :45pm. Pt aware of appt.

## 2016-03-22 ENCOUNTER — Ambulatory Visit
Admission: RE | Admit: 2016-03-22 | Discharge: 2016-03-22 | Disposition: A | Discharge status: Home / Self Care | Payer: BLUE CROSS/BLUE SHIELD | Source: Ambulatory Visit

## 2016-03-22 DIAGNOSIS — Z1231 Encounter for screening mammogram for malignant neoplasm of breast: Secondary | ICD-10-CM

## 2016-03-29 ENCOUNTER — Ambulatory Visit: Payer: Self-pay

## 2016-04-20 ENCOUNTER — Ambulatory Visit: Payer: Self-pay | Admitting: Internal Medicine

## 2016-04-20 DIAGNOSIS — R519 Headache, unspecified: Secondary | ICD-10-CM | POA: Insufficient documentation

## 2016-04-20 DIAGNOSIS — R51 Headache: Secondary | ICD-10-CM

## 2016-06-19 ENCOUNTER — Ambulatory Visit (INDEPENDENT_AMBULATORY_CARE_PROVIDER_SITE_OTHER): Payer: Self-pay | Admitting: Physician Assistant

## 2016-06-19 VITALS — BP 120/78 | HR 72 | Temp 98.3°F | Resp 18 | Ht 63.0 in | Wt 135.0 lb

## 2016-06-19 DIAGNOSIS — M5431 Sciatica, right side: Secondary | ICD-10-CM

## 2016-06-19 DIAGNOSIS — N83209 Unspecified ovarian cyst, unspecified side: Secondary | ICD-10-CM | POA: Insufficient documentation

## 2016-06-19 MED ORDER — METHYLPREDNISOLONE SODIUM SUCC 125 MG IJ SOLR
125.0000 mg | Freq: Once | INTRAMUSCULAR | Status: AC
  Administered 2016-06-19: 125 mg via INTRAMUSCULAR

## 2016-06-19 MED ORDER — MELOXICAM 15 MG PO TABS: 15.0000 mg | ORAL_TABLET | Freq: Every day | ORAL | 0 refills | Status: DC

## 2016-06-19 MED ORDER — CYCLOBENZAPRINE HCL 10 MG PO TABS: 10.0000 mg | ORAL_TABLET | Freq: Three times a day (TID) | ORAL | 0 refills | Status: DC | PRN

## 2016-06-19 NOTE — Progress Notes (Signed)
Patient ID: Patricia Hutchinson, female    DOB: Jul 24, 1971, 45 y.o.   MRN: [id number]  PCP: Smothers, Andree Elk, NP  Subjective:   Chief Complaint  Patient presents with  . Back Pain    x4days. Radiates down right leg    HPI Presents for evaluation of bilateral LBP, R>L.  She is accompanied by her 72 year old son.  4 days ago, hurt her back by cooking all day for her family. No specific trauma or injury. She was barefoot on tile floor. That night, her back was terribly uncomfortable. Unable to get comfortable to sleep more than 30-60 minutes. Unable to sit for very long. Leaving for the beach tomorrow. Doesn't think she can ride in the car for the trip and make it another day without sleep. Requests a steroid injection.  Pain radiates down the RIGHT leg, not quite to the KNEE. No loss of bowel or bladder control. No saddle anesthesia. No weakness in the lower extremity. No numbness or tingling.  Had similar symptoms about 2 months ago following cleaning out the basement. Not nearly as bad as this.  Resolved after a few days.  Ibuprofen 800 mg without benefit. Wearing supportive shoes.  Review of Systems As above.    Patient Active Problem List   Diagnosis Date Noted  . Ovarian cyst 06/19/2016  . Headache 04/20/2016  . Numbness and tingling of left leg 06/10/2015  . Colon polyp 02/24/2015  . Gastro-esophageal reflux disease without esophagitis 11/26/2014  . Personal history of other specified conditions 01/18/2013  . Anxiety state 12/13/2012  . Avitaminosis D 12/12/2012     Prior to Admission medications   Medication Sig Start Date End Date Taking? Authorizing Provider  clonazePAM (KLONOPIN) 0.5 MG tablet Take 0.5 mg by mouth as needed for anxiety.   Yes Historical Provider, MD  fluticasone Asencion Islam) 50 MCG/ACT nasal spray  12/24/15  Yes Historical Provider, MD  Probiotic Product (PROBIOTIC DAILY PO) Take by mouth. Ultimate flora probiotic , 7 day treatment , one a day    Yes Historical Provider, MD  VITAMIN D, CHOLECALCIFEROL, PO Take by mouth. 1 drop daily , 2000iu   Yes Historical Provider, MD  TRINESSA, 28, 0.18/0.215/0.25 MG-35 MCG tablet TK 1 T PO QD 04/06/16   Historical Provider, MD     No Known Allergies     Objective:  Physical Exam  Constitutional: She is oriented to person, place, and time. She appears well-developed and well-nourished. She is active and cooperative. No distress.  BP 120/78 (BP Location: Left Arm, Patient Position: Sitting, Cuff Size: Normal)   Pulse 72   Temp 98.3 F (36.8 C) (Oral)   Resp 18   Ht 5\' 3"  (1.6 m)   Wt 135 lb (61.2 kg)   LMP 06/10/2016   SpO2 100%   BMI 23.91 kg/m   HENT:  Head: Normocephalic and atraumatic.  Right Ear: Hearing normal.  Left Ear: Hearing normal.  Eyes: Conjunctivae are normal. No scleral icterus.  Neck: Normal range of motion. Neck supple. No thyromegaly present.  Cardiovascular: Normal rate, regular rhythm and normal heart sounds.   Pulses:      Radial pulses are 2+ on the right side, and 2+ on the left side.  Pulmonary/Chest: Effort normal and breath sounds normal.  Musculoskeletal:       Right hip: Normal.       Cervical back: Normal.       Thoracic back: Normal.       Lumbar back:  She exhibits tenderness (paraspinous muscles, R>L) and pain. She exhibits normal range of motion, no bony tenderness, no swelling, no edema, no deformity, no laceration, no spasm and normal pulse.  Lymphadenopathy:       Head (right side): No tonsillar, no preauricular, no posterior auricular and no occipital adenopathy present.       Head (left side): No tonsillar, no preauricular, no posterior auricular and no occipital adenopathy present.    She has no cervical adenopathy.       Right: No supraclavicular adenopathy present.       Left: No supraclavicular adenopathy present.  Neurological: She is alert and oriented to person, place, and time. She has normal strength. No cranial nerve deficit or  sensory deficit. She displays a negative Romberg sign.  Reflex Scores:      Patellar reflexes are 2+ on the right side and 2+ on the left side.      Achilles reflexes are 2+ on the right side and 2+ on the left side. Skin: Skin is warm, dry and intact. No rash noted. No cyanosis or erythema. Nails show no clubbing.  Psychiatric: She has a normal mood and affect. Her speech is normal and behavior is normal.           Assessment & Plan:   1. Sciatica of right side Anticipatory guidance. Exercises. Ice.  - cyclobenzaprine (FLEXERIL) 10 MG tablet; Take 1 tablet (10 mg total) by mouth 3 (three) times daily as needed for muscle spasms.  Dispense: 30 tablet; Refill: 0 - meloxicam (MOBIC) 15 MG tablet; Take 1 tablet (15 mg total) by mouth daily. Take with food.  Dispense: 30 tablet; Refill: 0 - methylPREDNISolone sodium succinate (SOLU-MEDROL) 125 mg/2 mL injection 125 mg; Inject 2 mLs (125 mg total) into the muscle once.   Fara Chute, PA-C Physician Assistant-Certified [facility]

## 2016-06-19 NOTE — Patient Instructions (Addendum)
Start the meloxicam TOMORROW. Take it with food.    IF you received an x-ray today, you will receive an invoice from [facility]. Please contact [facility] at [phone] with questions or concerns regarding your invoice.   IF you received labwork today, you will receive an invoice from [employer]. Please contact [facility] at [phone] with questions or concerns regarding your invoice.   Our billing staff will not be able to assist you with questions regarding bills from these companies.  You will be contacted with the lab results as soon as they are available. The fastest way to get your results is to activate your My Chart account. Instructions are located on the last page of this paperwork. If you have not heard from [place] regarding the results in 2 weeks, please contact this office.     Sciatica With Rehab The sciatic nerve runs from the back down the leg and is responsible for sensation and control of the muscles in the back (posterior) side of the thigh, lower leg, and foot. Sciatica is a condition that is characterized by inflammation of this nerve.  SYMPTOMS   Signs of nerve damage, including numbness and/or weakness along the posterior side of the lower extremity.  Pain in the back of the thigh that may also travel down the leg.  Pain that worsens when sitting for long periods of time.  Occasionally, pain in the back or buttock. CAUSES  Inflammation of the sciatic nerve is the cause of sciatica. The inflammation is due to something irritating the nerve. Common sources of irritation include:  Sitting for long periods of time.  Direct trauma to the nerve.  Arthritis of the spine.  Herniated or ruptured disk.  Slipping of the vertebrae (spondylolisthesis).  Pressure from soft tissues, such as muscles or ligament-like tissue (fascia). RISK INCREASES WITH:  Sports that place pressure or stress on the spine (football or  weightlifting).  Poor strength and flexibility.  Failure to warm up properly before activity.  Family history of low back pain or disk disorders.  Previous back injury or surgery.  Poor body mechanics, especially when lifting, or poor posture. PREVENTION   Warm up and stretch properly before activity.  Maintain physical fitness:  Strength, flexibility, and endurance.  Cardiovascular fitness.  Learn and use proper technique, especially with posture and lifting. When possible, have coach correct improper technique.  Avoid activities that place stress on the spine. PROGNOSIS If treated properly, then sciatica usually resolves within 6 weeks. However, occasionally surgery is necessary.  RELATED COMPLICATIONS   Permanent nerve damage, including pain, numbness, tingle, or weakness.  Chronic back pain.  Risks of surgery: infection, bleeding, nerve damage, or damage to surrounding tissues. TREATMENT Treatment initially involves resting from any activities that aggravate your symptoms. The use of ice and medication may help reduce pain and inflammation. The use of strengthening and stretching exercises may help reduce pain with activity. These exercises may be performed at home or with referral to a therapist. A therapist may recommend further treatments, such as transcutaneous electronic nerve stimulation (TENS) or ultrasound. Your caregiver may recommend corticosteroid injections to help reduce inflammation of the sciatic nerve. If symptoms persist despite non-surgical (conservative) treatment, then surgery may be recommended. MEDICATION  If pain medication is necessary, then nonsteroidal anti-inflammatory medications, such as aspirin and ibuprofen, or other minor pain relievers, such as acetaminophen, are often recommended.  Do not take pain medication for 7 days before surgery.  Prescription pain relievers may  be given if deemed necessary by your caregiver. Use only as directed  and only as much as you need.  Ointments applied to the skin may be helpful.  Corticosteroid injections may be given by your caregiver. These injections should be reserved for the most serious cases, because they may only be given a certain number of times. HEAT AND COLD  Cold treatment (icing) relieves pain and reduces inflammation. Cold treatment should be applied for 10 to 15 minutes every 2 to 3 hours for inflammation and pain and immediately after any activity that aggravates your symptoms. Use ice packs or massage the area with a piece of ice (ice massage).  Heat treatment may be used prior to performing the stretching and strengthening activities prescribed by your caregiver, physical therapist, or athletic trainer. Use a heat pack or soak the injury in warm water. SEEK MEDICAL CARE IF:  Treatment seems to offer no benefit, or the condition worsens.  Any medications produce adverse side effects. EXERCISES  RANGE OF MOTION (ROM) AND STRETCHING EXERCISES - Sciatica Most people with sciatic will find that their symptoms worsen with either excessive bending forward (flexion) or arching at the low back (extension). The exercises which will help resolve your symptoms will focus on the opposite motion. Your physician, physical therapist or athletic trainer will help you determine which exercises will be most helpful to resolve your low back pain. Do not complete any exercises without first consulting with your clinician. Discontinue any exercises which worsen your symptoms until you speak to your clinician. If you have pain, numbness or tingling which travels down into your buttocks, leg or foot, the goal of the therapy is for these symptoms to move closer to your back and eventually resolve. Occasionally, these leg symptoms will get better, but your low back pain may worsen; this is typically an indication of progress in your rehabilitation. Be certain to be very alert to any changes in your  symptoms and the activities in which you participated in the 24 hours prior to the change. Sharing this information with your clinician will allow him/her to most efficiently treat your condition. These exercises may help you when beginning to rehabilitate your injury. Your symptoms may resolve with or without further involvement from your physician, physical therapist or athletic trainer. While completing these exercises, remember:   Restoring tissue flexibility helps normal motion to return to the joints. This allows healthier, less painful movement and activity.  An effective stretch should be held for at least 30 seconds.  A stretch should never be painful. You should only feel a gentle lengthening or release in the stretched tissue. FLEXION RANGE OF MOTION AND STRETCHING EXERCISES: STRETCH - Flexion, Single Knee to Chest   Lie on a firm bed or floor with both legs extended in front of you.  Keeping one leg in contact with the floor, bring your opposite knee to your chest. Hold your leg in place by either grabbing behind your thigh or at your knee.  Pull until you feel a gentle stretch in your low back. Hold ____5-10______ seconds.  Slowly release your grasp and repeat the exercise with the opposite side. Repeat ____5-10______ times. Complete this exercise _____1-2_____ times per day.  STRETCH - Flexion, Double Knee to Chest  Lie on a firm bed or floor with both legs extended in front of you.  Keeping one leg in contact with the floor, bring your opposite knee to your chest.  Tense your stomach muscles to support your back  and then lift your other knee to your chest. Hold your legs in place by either grabbing behind your thighs or at your knees.  Pull both knees toward your chest until you feel a gentle stretch in your low back. Hold __________ seconds.  Tense your stomach muscles and slowly return one leg at a time to the floor. Repeat __________ times. Complete this exercise  __________ times per day.  STRETCH - Low Trunk Rotation   Lie on a firm bed or floor. Keeping your legs in front of you, bend your knees so they are both pointed toward the ceiling and your feet are flat on the floor.  Extend your arms out to the side. This will stabilize your upper body by keeping your shoulders in contact with the floor.  Gently and slowly drop both knees together to one side until you feel a gentle stretch in your low back. Hold for __________ seconds.  Tense your stomach muscles to support your low back as you bring your knees back to the starting position. Repeat the exercise to the other side. Repeat __________ times. Complete this exercise __________ times per day  EXTENSION RANGE OF MOTION AND FLEXIBILITY EXERCISES: STRETCH - Extension, Prone on Elbows  Lie on your stomach on the floor, a bed will be too soft. Place your palms about shoulder width apart and at the height of your head.  Place your elbows under your shoulders. If this is too painful, stack pillows under your chest.  Allow your body to relax so that your hips drop lower and make contact more completely with the floor.  Hold this position for __________ seconds.  Slowly return to lying flat on the floor. Repeat __________ times. Complete this exercise __________ times per day.  RANGE OF MOTION - Extension, Prone Press Ups  Lie on your stomach on the floor, a bed will be too soft. Place your palms about shoulder width apart and at the height of your head.  Keeping your back as relaxed as possible, slowly straighten your elbows while keeping your hips on the floor. You may adjust the placement of your hands to maximize your comfort. As you gain motion, your hands will come more underneath your shoulders.  Hold this position __________ seconds.  Slowly return to lying flat on the floor. Repeat __________ times. Complete this exercise __________ times per day.  STRENGTHENING EXERCISES - Sciatica   These exercises may help you when beginning to rehabilitate your injury. These exercises should be done near your "sweet spot." This is the neutral, low-back arch, somewhere between fully rounded and fully arched, that is your least painful position. When performed in this safe range of motion, these exercises can be used for people who have either a flexion or extension based injury. These exercises may resolve your symptoms with or without further involvement from your physician, physical therapist or athletic trainer. While completing these exercises, remember:   Muscles can gain both the endurance and the strength needed for everyday activities through controlled exercises.  Complete these exercises as instructed by your physician, physical therapist or athletic trainer. Progress with the resistance and repetition exercises only as your caregiver advises.  You may experience muscle soreness or fatigue, but the pain or discomfort you are trying to eliminate should never worsen during these exercises. If this pain does worsen, stop and make certain you are following the directions exactly. If the pain is still present after adjustments, discontinue the exercise until you can discuss the  trouble with your clinician. STRENGTHENING - Deep Abdominals, Pelvic Tilt   Lie on a firm bed or floor. Keeping your legs in front of you, bend your knees so they are both pointed toward the ceiling and your feet are flat on the floor.  Tense your lower abdominal muscles to press your low back into the floor. This motion will rotate your pelvis so that your tail bone is scooping upwards rather than pointing at your feet or into the floor.  With a gentle tension and even breathing, hold this position for __________ seconds. Repeat __________ times. Complete this exercise __________ times per day.  STRENGTHENING - Abdominals, Crunches   Lie on a firm bed or floor. Keeping your legs in front of you, bend your knees  so they are both pointed toward the ceiling and your feet are flat on the floor. Cross your arms over your chest.  Slightly tip your chin down without bending your neck.  Tense your abdominals and slowly lift your trunk high enough to just clear your shoulder blades. Lifting higher can put excessive stress on the low back and does not further strengthen your abdominal muscles.  Control your return to the starting position. Repeat __________ times. Complete this exercise __________ times per day.  STRENGTHENING - Quadruped, Opposite UE/LE Lift  Assume a hands and knees position on a firm surface. Keep your hands under your shoulders and your knees under your hips. You may place padding under your knees for comfort.  Find your neutral spine and gently tense your abdominal muscles so that you can maintain this position. Your shoulders and hips should form a rectangle that is parallel with the floor and is not twisted.  Keeping your trunk steady, lift your right hand no higher than your shoulder and then your left leg no higher than your hip. Make sure you are not holding your breath. Hold this position __________ seconds.  Continuing to keep your abdominal muscles tense and your back steady, slowly return to your starting position. Repeat with the opposite arm and leg. Repeat __________ times. Complete this exercise __________ times per day.  STRENGTHENING - Abdominals and Quadriceps, Straight Leg Raise   Lie on a firm bed or floor with both legs extended in front of you.  Keeping one leg in contact with the floor, bend the other knee so that your foot can rest flat on the floor.  Find your neutral spine, and tense your abdominal muscles to maintain your spinal position throughout the exercise.  Slowly lift your straight leg off the floor about 6 inches for a count of 15, making sure to not hold your breath.  Still keeping your neutral spine, slowly lower your leg all the way to the  floor. Repeat this exercise with each leg __________ times. Complete this exercise __________ times per day. POSTURE AND BODY MECHANICS CONSIDERATIONS - Sciatica Keeping correct posture when sitting, standing or completing your activities will reduce the stress put on different body tissues, allowing injured tissues a chance to heal and limiting painful experiences. The following are general guidelines for improved posture. Your physician or physical therapist will provide you with any instructions specific to your needs. While reading these guidelines, remember:  The exercises prescribed by your provider will help you have the flexibility and strength to maintain correct postures.  The correct posture provides the optimal environment for your joints to work. All of your joints have less wear and tear when properly supported by a spine with  good posture. This means you will experience a healthier, less painful body.  Correct posture must be practiced with all of your activities, especially prolonged sitting and standing. Correct posture is as important when doing repetitive low-stress activities (typing) as it is when doing a single heavy-load activity (lifting). RESTING POSITIONS Consider which positions are most painful for you when choosing a resting position. If you have pain with flexion-based activities (sitting, bending, stooping, squatting), choose a position that allows you to rest in a less flexed posture. You would want to avoid curling into a fetal position on your side. If your pain worsens with extension-based activities (prolonged standing, working overhead), avoid resting in an extended position such as sleeping on your stomach. Most people will find more comfort when they rest with their spine in a more neutral position, neither too rounded nor too arched. Lying on a non-sagging bed on your side with a pillow between your knees, or on your back with a pillow under your knees will often  provide some relief. Keep in mind, being in any one position for a prolonged period of time, no matter how correct your posture, can still lead to stiffness. PROPER SITTING POSTURE In order to minimize stress and discomfort on your spine, you must sit with correct posture Sitting with good posture should be effortless for a healthy body. Returning to good posture is a gradual process. Many people can work toward this most comfortably by using various supports until they have the flexibility and strength to maintain this posture on their own. When sitting with proper posture, your ears will fall over your shoulders and your shoulders will fall over your hips. You should use the back of the chair to support your upper back. Your low back will be in a neutral position, just slightly arched. You may place a small pillow or folded towel at the base of your low back for support.  When working at a desk, create an environment that supports good, upright posture. Without extra support, muscles fatigue and lead to excessive strain on joints and other tissues. Keep these recommendations in mind: CHAIR:   A chair should be able to slide under your desk when your back makes contact with the back of the chair. This allows you to work closely.  The chair's height should allow your eyes to be level with the upper part of your monitor and your hands to be slightly lower than your elbows. BODY POSITION  Your feet should make contact with the floor. If this is not possible, use a foot rest.  Keep your ears over your shoulders. This will reduce stress on your neck and low back. INCORRECT SITTING POSTURES   If you are feeling tired and unable to assume a healthy sitting posture, do not slouch or slump. This puts excessive strain on your back tissues, causing more damage and pain. Healthier options include:  Using more support, like a lumbar pillow.  Switching tasks to something that requires you to be upright or  walking.  Talking a brief walk.  Lying down to rest in a neutral-spine position. PROLONGED STANDING WHILE SLIGHTLY LEANING FORWARD  When completing a task that requires you to lean forward while standing in one place for a long time, place either foot up on a stationary 2-4 inch high object to help maintain the best posture. When both feet are on the ground, the low back tends to lose its slight inward curve. If this curve flattens (or becomes too  large), then the back and your other joints will experience too much stress, fatigue more quickly and can cause pain.  CORRECT STANDING POSTURES Proper standing posture should be assumed with all daily activities, even if they only take a few moments, like when brushing your teeth. As in sitting, your ears should fall over your shoulders and your shoulders should fall over your hips. You should keep a slight tension in your abdominal muscles to brace your spine. Your tailbone should point down to the ground, not behind your body, resulting in an over-extended swayback posture.  INCORRECT STANDING POSTURES  Common incorrect standing postures include a forward head, locked knees and/or an excessive swayback. WALKING Walk with an upright posture. Your ears, shoulders and hips should all line-up. PROLONGED ACTIVITY IN A FLEXED POSITION When completing a task that requires you to bend forward at your waist or lean over a low surface, try to find a way to stabilize 3 of 4 of your limbs. You can place a hand or elbow on your thigh or rest a knee on the surface you are reaching across. This will provide you more stability so that your muscles do not fatigue as quickly. By keeping your knees relaxed, or slightly bent, you will also reduce stress across your low back. CORRECT LIFTING TECHNIQUES DO :   Assume a wide stance. This will provide you more stability and the opportunity to get as close as possible to the object which you are lifting.  Tense your  abdominals to brace your spine; then bend at the knees and hips. Keeping your back locked in a neutral-spine position, lift using your leg muscles. Lift with your legs, keeping your back straight.  Test the weight of unknown objects before attempting to lift them.  Try to keep your elbows locked down at your sides in order get the best strength from your shoulders when carrying an object.  Always ask for help when lifting heavy or awkward objects. INCORRECT LIFTING TECHNIQUES DO NOT:   Lock your knees when lifting, even if it is a small object.  Bend and twist. Pivot at your feet or move your feet when needing to change directions.  Assume that you cannot safely pick up a paperclip without proper posture.   This information is not intended to replace advice given to you by your health care provider. Make sure you discuss any questions you have with your health care provider.   Document Released: 11/08/2005 Document Revised: 03/25/2015 Document Reviewed: 02/20/2009 [employer] Interactive Patient Education [employer].

## 2016-10-04 ENCOUNTER — Encounter: Payer: Self-pay | Admitting: Physician Assistant

## 2017-02-07 ENCOUNTER — Ambulatory Visit (HOSPITAL_COMMUNITY)
Admission: EM | Admit: 2017-02-07 | Discharge: 2017-02-07 | Disposition: A | Discharge status: Home / Self Care | Payer: Self-pay | Attending: Family Medicine | Admitting: Family Medicine

## 2017-02-07 ENCOUNTER — Encounter (HOSPITAL_COMMUNITY): Payer: Self-pay | Admitting: Emergency Medicine

## 2017-02-07 ENCOUNTER — Ambulatory Visit (INDEPENDENT_AMBULATORY_CARE_PROVIDER_SITE_OTHER): Payer: Self-pay

## 2017-02-07 DIAGNOSIS — S93402A Sprain of unspecified ligament of left ankle, initial encounter: Secondary | ICD-10-CM

## 2017-02-07 MED ORDER — IBUPROFEN 600 MG PO TABS: 600.0000 mg | ORAL_TABLET | Freq: Four times a day (QID) | ORAL | 0 refills | Status: DC | PRN

## 2017-02-07 MED ORDER — OMEPRAZOLE 20 MG PO CPDR: 20.0000 mg | DELAYED_RELEASE_CAPSULE | Freq: Two times a day (BID) | ORAL | 0 refills | Status: DC

## 2017-02-07 NOTE — ED Provider Notes (Signed)
CSN: [id number]     Arrival date & time 02/07/17  1955 History   None    Chief Complaint  Patient presents with  . Ankle Pain   (Consider location/radiation/quality/duration/timing/severity/associated sxs/prior Treatment) Patient rolled her left ankle in today and she states she felt it go back in place when she first injured her ankle.  She has hx of severe left ankle sprain in past.  She has hx of GERD. She took 2 ibuprofen before coming.    The history is provided by the patient.  Ankle Pain  Location:  Ankle Time since incident:  2 hours Injury: yes   Ankle location:  L ankle Pain details:    Quality:  Aching   Radiates to:  Does not radiate   Severity:  Moderate   Onset quality:  Sudden   Duration:  2 hours   Timing:  Constant Chronicity:  New Dislocation: no   Foreign body present:  Unable to specify Tetanus status:  Unknown Relieved by:  NSAIDs Worsened by:  Nothing Ineffective treatments:  None tried Associated symptoms: swelling     Past Medical History:  Diagnosis Date  . Anxiety   . Colon polyp   . GERD (gastroesophageal reflux disease)   . Heart murmur   . Hyperlipidemia 36   borderline  . PVCs (premature ventricular contractions)    Past Surgical History:  Procedure Laterality Date  . DILATION AND CURETTAGE OF UTERUS     x 2   Family History  Problem Relation Age of Onset  . Diabetes Father     borderline  . Colon polyps Father   . Arrhythmia Father     V Tach; implanted defibrillator  . ADD / ADHD Son   . Breast cancer Maternal Grandmother     dx in her 57's  . Breast cancer      cousin, dx in her 65's  . Colon cancer Neg Hx    Social History  Substance Use Topics  . Smoking status: Never Smoker  . Smokeless tobacco: Never Used  . Alcohol use 0.0 oz/week     Comment: occ   OB History    No data available     Review of Systems  Constitutional: Negative.   HENT: Negative.   Eyes: Negative.   Respiratory: Negative.    Cardiovascular: Negative.   Gastrointestinal: Negative.   Endocrine: Negative.   Genitourinary: Negative.   Musculoskeletal: Positive for arthralgias.  Allergic/Immunologic: Negative.   Neurological: Negative.   Hematological: Negative.   Psychiatric/Behavioral: Negative.     Allergies  Patient has no known allergies.  Home Medications   Prior to Admission medications   Medication Sig Start Date End Date Taking? Authorizing Provider  clonazePAM (KLONOPIN) 0.5 MG tablet Take 0.5 mg by mouth as needed for anxiety.    Historical Provider, MD  cyclobenzaprine (FLEXERIL) 10 MG tablet Take 1 tablet (10 mg total) by mouth 3 (three) times daily as needed for muscle spasms. 06/19/16   Chelle Jeffery, PA-C  fluticasone (FLONASE) 50 MCG/ACT nasal spray  12/24/15   Historical Provider, MD  ibuprofen (ADVIL,MOTRIN) 600 MG tablet Take 1 tablet (600 mg total) by mouth every 6 (six) hours as needed. 02/07/17   Lysbeth Penner, FNP  meloxicam (MOBIC) 15 MG tablet Take 1 tablet (15 mg total) by mouth daily. Take with food. 06/19/16   Chelle Jeffery, PA-C  omeprazole (PRILOSEC) 20 MG capsule Take 1 capsule (20 mg total) by mouth 2 (two) times daily before a  meal. 02/07/17   Lysbeth Penner, FNP  Probiotic Product (PROBIOTIC DAILY PO) Take by mouth. Ultimate flora probiotic , 7 day treatment , one a day    Historical Provider, MD  TRINESSA, 28, 0.18/0.215/0.25 MG-35 MCG tablet TK 1 T PO QD 04/06/16   Historical Provider, MD  VITAMIN D, CHOLECALCIFEROL, PO Take by mouth. 1 drop daily , 2000iu    Historical Provider, MD   Meds Ordered and Administered this Visit  Medications - No data to display  BP 140/89 (BP Location: Right Arm)   Pulse 94   Temp 98.6 F (37 C) (Oral)   Resp 16   LMP 01/24/2017   SpO2 99%  No data found.   Physical Exam  Constitutional: She appears well-developed and well-nourished.  HENT:  Head: Normocephalic and atraumatic.  Eyes: Conjunctivae and EOM are normal. Pupils are  equal, round, and reactive to light.  Neck: Normal range of motion. Neck supple.  Cardiovascular: Normal rate, regular rhythm and normal heart sounds.   Pulmonary/Chest: Effort normal and breath sounds normal.  Musculoskeletal: She exhibits tenderness.  Left lateral anterior ankle swollen and tender.  Nursing note and vitals reviewed.   Urgent Care Course     Procedures (including critical care time)  Labs Review Labs Reviewed - No data to display  Imaging Review Dg Ankle Complete Left  Result Date: 02/07/2017 CLINICAL DATA:  Left ankle twisting injury. Pain and swelling in left ankle . Today EXAM: LEFT ANKLE COMPLETE - 3+ VIEW COMPARISON:  None. FINDINGS: There is prominent anterolateral left ankle soft tissue swelling. No fracture. No subluxation. No suspicious focal osseous lesion. No appreciable arthropathy. No radiopaque foreign body. IMPRESSION: Prominent anterolateral left ankle soft tissue swelling, with no fracture or subluxation. Electronically Signed   By: Ilona Sorrel M.D.   On: 02/07/2017 20:51     Visual Acuity Review  Right Eye Distance:   Left Eye Distance:   Bilateral Distance:    Right Eye Near:   Left Eye Near:    Bilateral Near:         MDM   1. Sprain of left ankle, unspecified ligament, initial encounter    Cam Walker Referral to Ortho for severe ankle sprain  Ibuprofen 600mg  one po tid x 10 days #30 Prilosec 20mg  one po bid #20      Lysbeth Penner, FNP 02/07/17 2121

## 2017-02-07 NOTE — ED Triage Notes (Signed)
Patient reports rolling left ankle a few hours ago.  Left lateral ankle swelling and pain

## 2017-04-07 ENCOUNTER — Other Ambulatory Visit: Payer: Self-pay | Admitting: Family

## 2017-04-07 DIAGNOSIS — Z1231 Encounter for screening mammogram for malignant neoplasm of breast: Secondary | ICD-10-CM

## 2017-04-22 ENCOUNTER — Ambulatory Visit: Payer: Self-pay | Admitting: Physician Assistant

## 2017-04-22 ENCOUNTER — Ambulatory Visit
Admission: RE | Admit: 2017-04-22 | Discharge: 2017-04-22 | Disposition: A | Discharge status: Home / Self Care | Payer: BLUE CROSS/BLUE SHIELD | Source: Ambulatory Visit | Attending: Family | Admitting: Family

## 2017-04-22 DIAGNOSIS — Z1231 Encounter for screening mammogram for malignant neoplasm of breast: Secondary | ICD-10-CM

## 2017-04-29 ENCOUNTER — Telehealth: Payer: Self-pay | Admitting: General Practice

## 2017-04-29 ENCOUNTER — Encounter: Payer: Self-pay | Admitting: Physician Assistant

## 2017-04-29 ENCOUNTER — Ambulatory Visit (INDEPENDENT_AMBULATORY_CARE_PROVIDER_SITE_OTHER): Payer: Self-pay | Admitting: Physician Assistant

## 2017-04-29 ENCOUNTER — Ambulatory Visit (INDEPENDENT_AMBULATORY_CARE_PROVIDER_SITE_OTHER): Payer: Self-pay

## 2017-04-29 VITALS — BP 128/82 | HR 88 | Temp 98.3°F | Resp 16 | Ht 63.2 in | Wt 138.0 lb

## 2017-04-29 DIAGNOSIS — R140 Abdominal distension (gaseous): Secondary | ICD-10-CM

## 2017-04-29 DIAGNOSIS — R002 Palpitations: Secondary | ICD-10-CM

## 2017-04-29 NOTE — Progress Notes (Signed)
Patient ID: Patricia Hutchinson, female    DOB: Mar 30, 1971, 46 y.o.   MRN: [id number]  PCP: Smothers, Andree Elk, NP  Chief Complaint  Patient presents with  . Palpitations  . Bloated    Subjective:   Presents for evaluation of heart palpitations and feeling bloated.  She has experienced these symptoms previously and underwent EKG, echocardiogram and EGD. She was noted to have mild esophagitis, and advised that it would resolve, which it did, within 2 weeks.  Symptoms have recurred. Heartbeat awareness. Palpitations after eating and when LEFT side-lying. "jittery and nervous." Dyspnea with the exertion of stair climbing, but not with power walking for exercise (4 miles). Abdominal bloating after eating, "It looks like I'm pregnant." Increased belching. No heartburn. No chest pain or pressure.  Recently had a cough, productive of white sputum. During a coughing spell, she felt a pop in the RIGHT breast area, "That feels like it might be a muscle." She is using a Neti pot for sinus symptoms.  Home BP yesterday was 98/72, which worried her, but was not associated with dizziness, near-syncope, fatigue, SOB.  She is using Ashwagonda root to reduce anxiety, and only uses clonazepam when she experiences a panic attack.   Review of Systems HENT: Positive for congestion and sinus pressure.   Respiratory: Positive for cough and shortness of breath (with ascending stairs, and after eatign). Negative for apnea, choking, chest tightness, wheezing and stridor.   Cardiovascular: Positive for palpitations. Negative for chest pain and leg swelling.  Gastrointestinal: Positive for abdominal distention and diarrhea. Negative for abdominal pain, anal bleeding, blood in stool, constipation, nausea, rectal pain and vomiting.  Genitourinary: Negative for hematuria.  Musculoskeletal: Positive for arthralgias (heat down medial aspect of right forearm).  Neurological: Positive for light-headedness  (assoc with her anxiety). Negative for dizziness.  Hematological: Negative.   Psychiatric/Behavioral: The patient is nervous/anxious.      Patient Active Problem List   Diagnosis Date Noted  . Ovarian cyst 06/19/2016  . Headache 04/20/2016  . Numbness and tingling of left leg 06/10/2015  . Colon polyp 02/24/2015  . Gastro-esophageal reflux disease without esophagitis 11/26/2014  . Personal history of other specified conditions 01/18/2013  . History of abnormal mammogram 01/18/2013  . Anxiety state 12/13/2012  . Avitaminosis D 12/12/2012     Prior to Admission medications   Medication Sig Start Date End Date Taking? Authorizing Provider  clonazePAM (KLONOPIN) 0.5 MG tablet Take 0.5 mg by mouth as needed for anxiety.   Yes [provider]  fluticasone Asencion Islam) 50 MCG/ACT nasal spray  12/24/15  Yes [provider]  VITAMIN D, CHOLECALCIFEROL, PO Take by mouth. 1 drop daily , 2000iu   Yes [provider]  ibuprofen (ADVIL,MOTRIN) 600 MG tablet Take 1 tablet (600 mg total) by mouth every 6 (six) hours as needed. Patient not taking: Reported on 04/29/2017 02/07/17   Lysbeth Penner, FNP  Probiotic Product (PROBIOTIC DAILY PO) Take by mouth. Ultimate flora probiotic , 7 day treatment , one a day    [provider]     No Known Allergies     Objective:  Physical Exam  Constitutional: She is oriented to person, place, and time. She appears well-developed and well-nourished. She is active and cooperative. No distress.  BP 128/82   Pulse 88   Temp 98.3 F (36.8 C) (Oral)   Resp 16   Ht 5' 3.2" (1.605 m)   Wt 138 lb (62.6 kg)  SpO2 97%   BMI 24.29 kg/m   HENT:  Head: Normocephalic and atraumatic.  Right Ear: Hearing normal.  Left Ear: Hearing normal.  Eyes: Conjunctivae are normal. No scleral icterus.  Neck: Normal range of motion. Neck supple. No thyromegaly present.  Cardiovascular: Normal rate, regular rhythm, normal heart sounds and  intact distal pulses.   Pulses:      Radial pulses are 2+ on the right side, and 2+ on the left side.  Pulmonary/Chest: Effort normal and breath sounds normal.  Abdominal: Soft. Bowel sounds are normal. She exhibits no distension and no mass. There is no tenderness. There is no rebound and no guarding.  Lymphadenopathy:       Head (right side): No tonsillar, no preauricular, no posterior auricular and no occipital adenopathy present.       Head (left side): No tonsillar, no preauricular, no posterior auricular and no occipital adenopathy present.    She has no cervical adenopathy.       Right: No supraclavicular adenopathy present.       Left: No supraclavicular adenopathy present.  Neurological: She is alert and oriented to person, place, and time. No sensory deficit.  Skin: Skin is warm, dry and intact. No rash noted. No cyanosis or erythema. Nails show no clubbing.  Psychiatric: She has a normal mood and affect. Her speech is normal and behavior is normal.   Dg Abd Acute W/chest  Result Date: 04/29/2017 CLINICAL DATA:  Abdominal bloating.  Cardiac palpitations. EXAM: DG ABDOMEN ACUTE W/ 1V CHEST COMPARISON:  None. FINDINGS: PA chest: Lungs are clear. Heart size and pulmonary vascularity are normal. No adenopathy. Supine and upright abdomen: There is moderate stool in the colon. There is no bowel dilatation or air-fluid level to suggest bowel obstruction. No free air. No abnormal calcifications. Incidental note is made of spina bifida occulta at S1. IMPRESSION: No demonstrable bowel obstruction or free air.  Lungs clear. Electronically Signed   By: Lowella Grip III M.D.   On: 04/29/2017 12:06        Assessment & Plan:   1. Palpitations Possibly due to recurrent gastritis/esophagitis. Possibly her anxiety. EKG reviewed with physician and note abnormalities in the p waves in lead II. Will reach out to cardiology for advice on next step. - CBC - Comprehensive metabolic panel - T4,  free - TSH - DG Abd Acute W/Chest; Future - EKG 12-Lead  2. Bloating Previously associated with esophagitis. Plain radiographs reassuring. Await labs. OTC simethicone and ranitidine. If symptoms persist, would consider additional imaging, CT vs [place]. - CBC - Comprehensive metabolic panel - DG Abd Acute W/Chest; Future - Lipid panel    Return if symptoms worsen or fail to improve.   Fara Chute, PA-C [facility]

## 2017-04-29 NOTE — Telephone Encounter (Signed)
Pt wants to make sure that in the lab work she gets her vitamin d checked she forgot to say this when she was here today   Best number [phone]

## 2017-04-29 NOTE — Telephone Encounter (Signed)
Please call patient. If you get voicemail, let her know that her results so far are normal, and I've sent them to her in My Chart.  If you speak to her: -the EKG is not concerning. If the palpitations persist, we can refer her back to cardiology for additional evaluation. -the radiographs are NORMAL. -I will see if we can add the Vitamin D on to the blood we drew when she was here.

## 2017-04-29 NOTE — Telephone Encounter (Signed)
Called the patient, and left message on AM , advised to look in "My Chart".

## 2017-04-29 NOTE — Patient Instructions (Addendum)
1. Take the Simethicone (Gas-X, Phazyme), with meals and at bedtime. 2. Take ranitidine (Zantac) 150 mg twice each day. 3. Relax (you are right, this will help!).    IF you received an x-ray today, you will receive an invoice from [facility]. Please contact [facility] at [phone] with questions or concerns regarding your invoice.   IF you received labwork today, you will receive an invoice from [facility]. Please contact [facility] at [phone] with questions or concerns regarding your invoice.   Our billing staff will not be able to assist you with questions regarding bills from these companies.  You will be contacted with the lab results as soon as they are available. The fastest way to get your results is to activate your My Chart account. Instructions are located on the last page of this paperwork. If you have not heard from [place] regarding the results in 2 weeks, please contact this office.

## 2017-04-29 NOTE — Progress Notes (Signed)
Subjective:    Patient ID: Patricia Hutchinson, female    DOB: 1971-11-22, 46 y.o.   MRN: [id number] PCP: Smothers, Andree Elk, NP Chief Complaint  Patient presents with  . Palpitations  . Bloated    HPI: 46 y/o F patient, with a history significant for anxiety, presents today for complaints of palpitations and stomach bloating. She had similar symptoms in the past- had an ECG, echo and EDG. There was no pathology noted with the ECG and Echo. The EDG noted some minor esophagitis and she was told her symptoms should improve soon and they did within 2 weeks.  She states that she can always feel her heart beating, and feels the palpitations most after eating and when she is lying on her left side. When walking up stairs can feel her heart race and becomes a little short of breath but does not feel it when power walking 4 miles. Her stomach bloats after eating- "looks like I'm pregnant". She is burping more and gets a feeling that her "vagus nerve is irritated" before she burps. Her chest feels "jittery and nervous" after eating. Denies burning or pressure in her chest.  Had a cough recently with productive white sputum, that has improved over the last week. When coughing can feel "pop in right breast that feels like it might be muscle".  Concerned about her blood pressure- at home yesterday was 98/72. Denies any dizziness, lightheadedness, or syncope.  Feeling foggy that she attributes to sinus pressure- using netty pot which is helping.  Heart problems run in family. So she feels "on high alert" especially when related to her heart.  Started using Ashwagonda root supplement to help with anxiety and has decreased use of Klonopin to only when having full blown panic attack.     Patient Active Problem List   Diagnosis Date Noted  . Ovarian cyst 06/19/2016  . Headache 04/20/2016  . Numbness and tingling of left leg 06/10/2015  . Colon polyp 02/24/2015  . Gastro-esophageal reflux disease without  esophagitis 11/26/2014  . Personal history of other specified conditions 01/18/2013  . History of abnormal mammogram 01/18/2013  . Anxiety state 12/13/2012  . Avitaminosis D 12/12/2012   Past Medical History:  Diagnosis Date  . Anxiety   . Colon polyp   . GERD (gastroesophageal reflux disease)   . Heart murmur   . Hyperlipidemia 36   borderline  . PVCs (premature ventricular contractions)    Prior to Admission medications   Medication Sig Start Date End Date Taking? Authorizing Provider  clonazePAM (KLONOPIN) 0.5 MG tablet Take 0.5 mg by mouth as needed for anxiety.   Yes [provider]  fluticasone Asencion Islam) 50 MCG/ACT nasal spray  12/24/15  Yes [provider]  VITAMIN D, CHOLECALCIFEROL, PO Take by mouth. 1 drop daily , 2000iu   Yes [provider]  ibuprofen (ADVIL,MOTRIN) 600 MG tablet Take 1 tablet (600 mg total) by mouth every 6 (six) hours as needed. Patient not taking: Reported on 04/29/2017 02/07/17   Lysbeth Penner, FNP  Probiotic Product (PROBIOTIC DAILY PO) Take by mouth. Ultimate flora probiotic , 7 day treatment , one a day    [provider]   No Known Allergies  Review of Systems  HENT: Positive for congestion and sinus pressure.   Respiratory: Positive for cough and shortness of breath (with ascending stairs, and after eatign). Negative for apnea, choking, chest tightness, wheezing and stridor.   Cardiovascular: Positive for palpitations. Negative for chest  pain and leg swelling.  Gastrointestinal: Positive for abdominal distention and diarrhea. Negative for abdominal pain, anal bleeding, blood in stool, constipation, nausea, rectal pain and vomiting.  Genitourinary: Negative for hematuria.  Musculoskeletal: Positive for arthralgias (heat down medial aspect of right forearm).  Neurological: Positive for light-headedness (assoc with her anxiety). Negative for dizziness.  Hematological: Negative.   Psychiatric/Behavioral: The  patient is nervous/anxious.        Objective:   Physical Exam  Constitutional: She is oriented to person, place, and time. She appears well-developed and well-nourished. No distress.  BP 128/82   Pulse 88   Temp 98.3 F (36.8 C) (Oral)   Resp 16   Ht 5' 3.2" (1.605 m)   Wt 138 lb (62.6 kg)   SpO2 97%   BMI 24.29 kg/m    HENT:  Head: Normocephalic and atraumatic.  Eyes: Conjunctivae and EOM are normal. Pupils are equal, round, and reactive to light.  Neck: Normal range of motion. Neck supple. No thyromegaly present.  Cardiovascular: Normal rate, regular rhythm, normal heart sounds and intact distal pulses.   Pulmonary/Chest: Effort normal and breath sounds normal. No respiratory distress. She has no wheezes.  Abdominal: Soft. She exhibits no distension. Bowel sounds are increased.  Musculoskeletal: Normal range of motion.  Lymphadenopathy:    She has no cervical adenopathy.  Neurological: She is alert and oriented to person, place, and time.  Skin: Skin is warm and dry. She is not diaphoretic.  Psychiatric: She has a normal mood and affect. Her behavior is normal. Judgment and thought content normal.      Assessment & Plan:  1. Palpitations Labs to r/o new metabolic pathology that may be contributing to symptoms.  - CBC - Comprehensive metabolic panel - T4, free - TSH - DG Abd Acute W/Chest; Future - EKG 12-Lead  2. Bloating Take OTC Gas-X and ranitidine as needed for GERD. DG abd acute w/ chest to r/o cardiomegaly, pulmonary pathology and hiatal hernia.  - CBC  - Comprehensive metabolic panel - DG Abd Acute W/Chest; Future - Lipid panel  Return if symptoms worsen or fail to improve.

## 2017-04-30 LAB — LIPID PANEL
CHOLESTEROL TOTAL: 227 mg/dL — AB (ref 100–199)
Chol/HDL Ratio: 4.3 ratio (ref 0.0–4.4)
HDL: 53 mg/dL (ref >39)
LDL Calculated: 159 mg/dL — ABNORMAL HIGH (ref 0–99)
Triglycerides: 73 mg/dL (ref 0–149)
VLDL Cholesterol Cal: 15 mg/dL (ref 5–40)

## 2017-04-30 LAB — CBC
Hematocrit: 45.3 % (ref 34.0–46.6)
Hemoglobin: 14.2 g/dL (ref 11.1–15.9)
MCH: 26 pg — ABNORMAL LOW (ref 26.6–33.0)
MCHC: 31.3 g/dL — AB (ref 31.5–35.7)
MCV: 83 fL (ref 79–97)
PLATELETS: 243 10*3/uL (ref 150–379)
RBC: 5.47 x10E6/uL — ABNORMAL HIGH (ref 3.77–5.28)
RDW: 13.7 % (ref 12.3–15.4)
WBC: 5.7 10*3/uL (ref 3.4–10.8)

## 2017-04-30 LAB — COMPREHENSIVE METABOLIC PANEL
A/G RATIO: 1.8 (ref 1.2–2.2)
ALK PHOS: 46 IU/L (ref 39–117)
ALT: 12 IU/L (ref 0–32)
AST: 16 IU/L (ref 0–40)
Albumin: 4.9 g/dL (ref 3.5–5.5)
BUN/Creatinine Ratio: 15 (ref 9–23)
BUN: 11 mg/dL (ref 6–24)
Bilirubin Total: 0.5 mg/dL (ref 0.0–1.2)
CO2: 24 mmol/L (ref 18–29)
Calcium: 9.8 mg/dL (ref 8.7–10.2)
Chloride: 99 mmol/L (ref 96–106)
Creatinine, Ser: 0.71 mg/dL (ref 0.57–1.00)
GFR calc Af Amer: 120 mL/min/{1.73_m2} (ref >59)
GFR calc non Af Amer: 104 mL/min/{1.73_m2} (ref >59)
Globulin, Total: 2.7 g/dL (ref 1.5–4.5)
Glucose: 97 mg/dL (ref 65–99)
POTASSIUM: 3.9 mmol/L (ref 3.5–5.2)
SODIUM: 140 mmol/L (ref 134–144)
Total Protein: 7.6 g/dL (ref 6.0–8.5)

## 2017-04-30 LAB — TSH: TSH: 1.22 u[IU]/mL (ref 0.450–4.500)

## 2017-04-30 LAB — T4, FREE: Free T4: 1.46 ng/dL (ref 0.82–1.77)

## 2017-05-11 ENCOUNTER — Encounter: Payer: Self-pay | Admitting: Physician Assistant

## 2017-12-23 ENCOUNTER — Telehealth: Payer: Self-pay | Admitting: Physician Assistant

## 2017-12-23 NOTE — Telephone Encounter (Signed)
Copied from [place] [id number]. Topic: Quick Communication - See Telephone Encounter >> Dec 23, 2017  2:57 PM Vernona Rieger wrote: CRM for notification. See Telephone encounter for:   12/23/17.  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 They had told her to come every 5 years. She has had some changes in bowel movements (thin & runny) & wants to know does she need to call and sch another colonoscopy. Please call back @ [phone]

## 2017-12-24 NOTE — Telephone Encounter (Signed)
Called pt and left message for her to call in and make an appt to see Chelle (per Nira Conn).  When pt calls in, please make her the earliest appt that she is able to come in. - for reason you may put stomach issues.  Thanks!

## 2017-12-28 ENCOUNTER — Other Ambulatory Visit: Payer: Self-pay

## 2017-12-28 ENCOUNTER — Encounter: Payer: Self-pay | Admitting: Physician Assistant

## 2017-12-28 ENCOUNTER — Ambulatory Visit: Payer: BLUE CROSS/BLUE SHIELD | Admitting: Physician Assistant

## 2017-12-28 VITALS — BP 100/64 | HR 85 | Temp 99.3°F | Resp 18 | Ht 63.2 in | Wt 144.8 lb

## 2017-12-28 DIAGNOSIS — M359 Systemic involvement of connective tissue, unspecified: Secondary | ICD-10-CM | POA: Diagnosis not present

## 2017-12-28 DIAGNOSIS — R195 Other fecal abnormalities: Secondary | ICD-10-CM

## 2017-12-28 DIAGNOSIS — Z6379 Other stressful life events affecting family and household: Secondary | ICD-10-CM

## 2017-12-28 DIAGNOSIS — K219 Gastro-esophageal reflux disease without esophagitis: Secondary | ICD-10-CM

## 2017-12-28 DIAGNOSIS — L308 Other specified dermatitis: Secondary | ICD-10-CM

## 2017-12-28 DIAGNOSIS — N943 Premenstrual tension syndrome: Secondary | ICD-10-CM | POA: Diagnosis not present

## 2017-12-28 DIAGNOSIS — R194 Change in bowel habit: Secondary | ICD-10-CM

## 2017-12-28 MED ORDER — EPINEPHRINE 0.15 MG/0.15ML IJ SOAJ: 0.1500 mg | INTRAMUSCULAR | 99 refills | Status: AC | PRN

## 2017-12-28 NOTE — Patient Instructions (Addendum)
1. Continue Allegra 2. Proceed with dermatology and GYN visits. If not helpful, allergist is the next step. 3. Call Dr. Blanch Media office to schedule additional follow-up for the reflux, change in stool caliber     IF you received an x-ray today, you will receive an invoice from [facility]. Please contact [facility] at [phone] with questions or concerns regarding your invoice.   IF you received labwork today, you will receive an invoice from [place]. Please contact [facility] at [phone] with questions or concerns regarding your invoice.   Our billing staff will not be able to assist you with questions regarding bills from these companies.  You will be contacted with the lab results as soon as they are available. The fastest way to get your results is to activate your My Chart account. Instructions are located on the last page of this paperwork. If you have not heard from [place] regarding the results in 2 weeks, please contact this office.

## 2017-12-28 NOTE — Progress Notes (Signed)
Patient ID: Patricia Hutchinson, female    DOB: 06-19-1971, 47 y.o.   MRN: [id number]  PCP: Smothers, Andree Elk, NP  Chief Complaint  Patient presents with  . Allergies    possible allergies and some acid reflux     Subjective:   Presents for evaluation of allergies and acid reflux symptoms.  She is concerned that she may have a progesterone allergy.  She recalls being told that her progesterone level was increased during pregnancy. Describes hives that last month at a time, worse around the time of her menstrual cycle.  Most lesions are about 1 cm in diameter that they can be larger. Associated increase in acne, postnasal drainage, eczema (itching and dryness) of the palms of the hands and her toes.  No associated vaginal irritation or itching.  She develops itching on the insides of her cheeks and sloughing off of the mucosa 3-4 days ago without any recalled burn or specific irritation.  Use of over-the-counter oral antihistamines associated with depression and increased anxiety symptoms though seems to help the hives.  Menses are long.  Typically has spotting for 6-7 days followed by 3 days of heavy bleeding followed by 2 additional days of spotting.  Associated with "horrible PMS and exhaustion.  An emotional wreck."  Very irritable.  Her son will, has severe OCD.  Home life is therefore stressful.  Working on a variety of modalities to address his symptoms, including hypnotherapy EM DR and use of hep extract.   Review of Systems As above. No chest pain, shortness of breath, blurred vision, dizziness, headache, nausea, vomiting, diarrhea, constipation, urinary urgency/frequency/burning, new or unexplained muscle or joint pain.    Patient Active Problem List   Diagnosis Date Noted  . Ovarian cyst 06/19/2016  . Headache 04/20/2016  . Numbness and tingling of left leg 06/10/2015  . Colon polyp 02/24/2015  . Gastro-esophageal reflux disease without esophagitis 11/26/2014  .  Personal history of other specified conditions 01/18/2013  . History of abnormal mammogram 01/18/2013  . Anxiety state 12/13/2012  . Avitaminosis D 12/12/2012     Prior to Admission medications   Medication Sig Start Date End Date Taking? Authorizing Provider  Ascorbic Acid (VITA-C PO) Take by mouth daily as needed.   Yes [provider]  B Complex-C-E-Zn (VITA ZINC PO) Take by mouth.   Yes [provider]  clonazePAM (KLONOPIN) 0.5 MG tablet Take 0.5 mg by mouth as needed for anxiety.   Yes [provider]  L-THEANINE PO Take by mouth.   Yes [provider]  Magnesium 400 MG CAPS Take 400 mg by mouth daily.   Yes [provider]  Misc Natural Products [facility] COMPOUND PO) Take by mouth daily as needed.   Yes [provider]  TAURINE PO Take by mouth.   Yes [provider]  VITAMIN D, CHOLECALCIFEROL, PO Take by mouth. 1 drop daily , 2000iu   Yes [provider]  Zinc Acetate, Oral, (ZINC ACETATE PO) Take by mouth daily as needed.   Yes [provider]     No Known Allergies     Objective:  Physical Exam  Constitutional: She is oriented to person, place, and time. She appears well-developed and well-nourished. She is active and cooperative. No distress.  BP 100/64   Pulse 85   Temp 99.3 F (37.4 C) (Oral)   Resp 18   Ht 5' 3.2" (1.605 m)   Wt 144 lb 12.8 oz (65.7 kg)  SpO2 100%   BMI 25.49 kg/m   HENT:  Head: Normocephalic and atraumatic.  Right Ear: Hearing normal.  Left Ear: Hearing normal.  Eyes: Conjunctivae are normal. No scleral icterus.  Neck: Normal range of motion. Neck supple. No thyromegaly present.  Cardiovascular: Normal rate, regular rhythm and normal heart sounds.  Pulses:      Radial pulses are 2+ on the right side, and 2+ on the left side.  Pulmonary/Chest: Effort normal and breath sounds normal.  Lymphadenopathy:       Head (right side): No tonsillar, no preauricular,  no posterior auricular and no occipital adenopathy present.       Head (left side): No tonsillar, no preauricular, no posterior auricular and no occipital adenopathy present.    She has no cervical adenopathy.       Right: No supraclavicular adenopathy present.       Left: No supraclavicular adenopathy present.  Neurological: She is alert and oriented to person, place, and time. No sensory deficit.  Skin: Skin is warm, dry and intact. No rash noted. No cyanosis or erythema. Nails show no clubbing.  Psychiatric: She has a normal mood and affect. Her speech is normal and behavior is normal.      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  Check CBC.  Prescription for EpiPen in the event of anaphylaxis.  If upcoming dermatology and GYN visits are not helpful would recommend an allergist as the next step. - CBC with Differential/Platelet - EPINEPHrine 0.15 MG/0.15ML IJ injection; Inject 0.15 mLs (0.15 mg total) into the muscle as needed for anaphylaxis.  Dispense: 2 Device; Refill: prn  2. Gastroesophageal reflux disease, esophagitis presence not specified Exacerbated by stress/anxiety. History of esophagitis by upper endoscopy. REcommend re-evaluation with GI.  3. PMS (premenstrual syndrome) -Proceed with GYN appointment, as planned.  4. Stressful life events affecting family and household Encourage and support her family's efforts to address Will's symptoms.  Okay to continue as needed clonazepam.  5. Change in stool caliber Colonoscopy 3 years ago was normal.  Sessile polyp was precancerous, but entirely removed.  Recommend reevaluation with GI.   Return if symptoms worsen or fail to improve.   Fara Chute, PA-C [facility]

## 2017-12-29 LAB — CBC WITH DIFFERENTIAL/PLATELET
BASOS: 0 %
Basophils Absolute: 0 10*3/uL (ref 0.0–0.2)
EOS (ABSOLUTE): 0.1 10*3/uL (ref 0.0–0.4)
EOS: 2 %
HEMATOCRIT: 39.2 % (ref 34.0–46.6)
Hemoglobin: 12.9 g/dL (ref 11.1–15.9)
IMMATURE GRANULOCYTES: 0 %
Immature Grans (Abs): 0 10*3/uL (ref 0.0–0.1)
Lymphocytes Absolute: 1.2 10*3/uL (ref 0.7–3.1)
Lymphs: 21 %
MCH: 25.7 pg — AB (ref 26.6–33.0)
MCHC: 32.9 g/dL (ref 31.5–35.7)
MCV: 78 fL — ABNORMAL LOW (ref 79–97)
MONOS ABS: 0.4 10*3/uL (ref 0.1–0.9)
Monocytes: 6 %
NEUTROS ABS: 4 10*3/uL (ref 1.4–7.0)
NEUTROS PCT: 71 %
Platelets: 184 10*3/uL (ref 150–379)
RBC: 5.02 x10E6/uL (ref 3.77–5.28)
RDW: 15 % (ref 12.3–15.4)
WBC: 5.7 10*3/uL (ref 3.4–10.8)

## 2018-01-09 DIAGNOSIS — N926 Irregular menstruation, unspecified: Secondary | ICD-10-CM | POA: Insufficient documentation

## 2018-01-18 ENCOUNTER — Encounter: Payer: Self-pay | Admitting: Physician Assistant

## 2018-02-23 ENCOUNTER — Encounter: Payer: Self-pay | Admitting: Physician Assistant

## 2018-05-02 ENCOUNTER — Encounter: Payer: Self-pay | Admitting: Physician Assistant

## 2018-05-02 ENCOUNTER — Ambulatory Visit (INDEPENDENT_AMBULATORY_CARE_PROVIDER_SITE_OTHER): Payer: BLUE CROSS/BLUE SHIELD | Admitting: Physician Assistant

## 2018-05-02 ENCOUNTER — Other Ambulatory Visit: Payer: Self-pay

## 2018-05-02 VITALS — BP 102/80 | HR 80 | Temp 98.1°F | Resp 16 | Ht 63.2 in | Wt 148.0 lb

## 2018-05-02 DIAGNOSIS — R05 Cough: Secondary | ICD-10-CM | POA: Diagnosis not present

## 2018-05-02 DIAGNOSIS — Z13220 Encounter for screening for lipoid disorders: Secondary | ICD-10-CM

## 2018-05-02 DIAGNOSIS — Z0000 Encounter for general adult medical examination without abnormal findings: Secondary | ICD-10-CM | POA: Diagnosis not present

## 2018-05-02 DIAGNOSIS — E559 Vitamin D deficiency, unspecified: Secondary | ICD-10-CM

## 2018-05-02 DIAGNOSIS — Z130 Encounter for screening for diseases of the blood and blood-forming organs and certain disorders involving the immune mechanism: Secondary | ICD-10-CM

## 2018-05-02 DIAGNOSIS — Z1329 Encounter for screening for other suspected endocrine disorder: Secondary | ICD-10-CM

## 2018-05-02 DIAGNOSIS — R059 Cough, unspecified: Secondary | ICD-10-CM

## 2018-05-02 DIAGNOSIS — Z6826 Body mass index (BMI) 26.0-26.9, adult: Secondary | ICD-10-CM | POA: Insufficient documentation

## 2018-05-02 DIAGNOSIS — Z114 Encounter for screening for human immunodeficiency virus [HIV]: Secondary | ICD-10-CM | POA: Diagnosis not present

## 2018-05-02 DIAGNOSIS — Z13228 Encounter for screening for other metabolic disorders: Secondary | ICD-10-CM | POA: Diagnosis not present

## 2018-05-02 DIAGNOSIS — Z1389 Encounter for screening for other disorder: Secondary | ICD-10-CM | POA: Diagnosis not present

## 2018-05-02 NOTE — Progress Notes (Signed)
Subjective:    Patient ID: Patricia Hutchinson, female    DOB: 09-18-1971, 47 y.o.   MRN: [id number]   Review of Systems  Constitutional: Positive for fatigue (slowing for summer). Negative for chills, fever and unexpected weight change.  HENT: Positive for sinus pain (wax and wane with allergies). Negative for congestion, rhinorrhea and sore throat.   Eyes: Negative for visual disturbance.  Respiratory: Positive for cough (dry cough all the time that comes and goes). Negative for chest tightness and shortness of breath.   Cardiovascular: Negative for chest pain, palpitations and leg swelling.  Gastrointestinal: Negative for abdominal pain, blood in stool, constipation, diarrhea, nausea and vomiting.  Endocrine: Positive for heat intolerance (hotter at nighttime - wakes up at q5am sweating). Negative for cold intolerance.  Genitourinary: Negative for dysuria, frequency, hematuria, menstrual problem, vaginal bleeding, vaginal discharge and vaginal pain.  Musculoskeletal: Positive for arthralgias (ankle soreness). Negative for myalgias.  Skin: Negative for color change and rash.  Neurological: Negative for dizziness, light-headedness, numbness and headaches.  Psychiatric/Behavioral: Negative for agitation and dysphoric mood. The patient is nervous/anxious.    Patient Active Problem List   Diagnosis Date Noted  . BMI 26.0-26.9,adult 05/02/2018  . Irregular periods 01/09/2018  . Ovarian cyst 06/19/2016  . Headache 04/20/2016  . Numbness and tingling of left leg 06/10/2015  . Colon polyp 02/24/2015  . Gastro-esophageal reflux disease without esophagitis 11/26/2014  . Personal history of other specified conditions 01/18/2013  . History of abnormal mammogram 01/18/2013  . Anxiety state 12/13/2012  . Avitaminosis D 12/12/2012   Prior to Admission medications   Medication Sig Start Date End Date Taking? Authorizing Provider  Ascorbic Acid (VITA-C PO) Take by mouth daily as needed.   Yes  [provider]  B Complex-C-E-Zn (VITA ZINC PO) Take by mouth.   Yes [provider]  clonazePAM (KLONOPIN) 0.5 MG tablet Take 0.5 mg by mouth as needed for anxiety.   Yes [provider]  EPINEPHrine 0.15 MG/0.15ML IJ injection Inject 0.15 mLs (0.15 mg total) into the muscle as needed for anaphylaxis. 12/28/17  Yes Jeffery, Chelle, PA-C  L-THEANINE PO Take by mouth.   Yes [provider]  Magnesium 400 MG CAPS Take 400 mg by mouth daily.   Yes [provider]  Misc Natural Products [facility] COMPOUND PO) Take by mouth daily as needed.   Yes [provider]  TAURINE PO Take by mouth.   Yes [provider]  VITAMIN D, CHOLECALCIFEROL, PO Take by mouth. 1 drop daily , 2000iu   Yes [provider]  Zinc Acetate, Oral, (ZINC ACETATE PO) Take by mouth daily as needed.   Yes [provider]   No Known Allergies      Objective:   Physical Exam  Constitutional: She is oriented to person, place, and time. She appears well-developed and well-nourished. No distress.  HENT:  Head: Normocephalic and atraumatic.  Eyes: Pupils are equal, round, and reactive to light.  Neck: No thyromegaly present.  Cardiovascular: Regular rhythm and normal heart sounds. Exam reveals no gallop and no friction rub.  No murmur heard. Pulmonary/Chest: Effort normal and breath sounds normal. No stridor. She has no wheezes. She has no rales.  Abdominal: Soft. Bowel sounds are normal.  Lymphadenopathy:    She has no cervical adenopathy.  Neurological: She is alert and oriented to person, place, and time.  Reflex Scores:      Bicep reflexes are 2+ on the right side and 2+  on the left side.      Patellar reflexes are 2+ on the right side and 2+ on the left side.      Achilles reflexes are 2+ on the right side and 2+ on the left side. Skin: Skin is warm and dry.  Psychiatric: She has a normal mood and affect. Her behavior is normal.   Wt  Readings from Last 3 Encounters:  05/02/18 148 lb (67.1 kg)  12/28/17 144 lb 12.8 oz (65.7 kg)  04/29/17 138 lb (62.6 kg)      Assessment & Plan:  1. Annual physical exam Provided age appropriate anticipatory guidance.  2. Screening for deficiency anemia - CBC with Differential/Platelet  3. Screening for hyperlipidemia - Lipid panel  4. Screening for metabolic disorder - Comprehensive metabolic panel  5. Screening for thyroid disorder - TSH  6. Screening for blood or protein in urine - Urinalysis, dipstick only  7. Screening for HIV (human immunodeficiency virus) - HIV antibody  8. BMI 26.0-26.9,adult - Lifestyle alterations should be initiated: continued stress management, increased exercise, healthy dietary choices.   9. Cough Trial of Ranitidine 150mg  BID or TUMS to see if etiology related to GERD. May also try Loratadine 10mg  daily to distinguish if allergy related.   10. Avitaminosis D - VITAMIN D 25 Hydroxy (Vit-D Deficiency, Fractures)  Follow up with office if cough symptoms due not improve with trials above.

## 2018-05-02 NOTE — Patient Instructions (Addendum)
For the cough, consider OTC Tums or loratadine. If no improvement, let's re-evaluate.    IF you received an x-ray today, you will receive an invoice from [facility]. Please contact [facility] at [phone] with questions or concerns regarding your invoice.   IF you received labwork today, you will receive an invoice from [facility]. Please contact [facility] at [phone] with questions or concerns regarding your invoice.   Our billing staff will not be able to assist you with questions regarding bills from these companies.  You will be contacted with the lab results as soon as they are available. The fastest way to get your results is to activate your My Chart account. Instructions are located on the last page of this paperwork. If you have not heard from [place] regarding the results in 2 weeks, please contact this office.     Preventive Care 40-64 Years, Female Preventive care refers to lifestyle choices and visits with your health care provider that can promote health and wellness. What does preventive care include?  A yearly physical exam. This is also called an annual well check.  Dental exams once or twice a year.  Routine eye exams. Ask your health care provider how often you should have your eyes checked.  Personal lifestyle choices, including: ? Daily care of your teeth and gums. ? Regular physical activity. ? Eating a healthy diet. ? Avoiding tobacco and drug use. ? Limiting alcohol use. ? Practicing safe sex. ? Taking low-dose aspirin daily starting at age 34. ? Taking vitamin and mineral supplements as recommended by your health care provider. What happens during an annual well check? The services and screenings done by your health care provider during your annual well check will depend on your age, overall health, lifestyle risk factors, and family history of disease. Counseling Your health care provider may ask you questions about your:  Alcohol  use.  Tobacco use.  Drug use.  Emotional well-being.  Home and relationship well-being.  Sexual activity.  Eating habits.  Work and work Statistician.  Method of birth control.  Menstrual cycle.  Pregnancy history.  Screening You may have the following tests or measurements:  Height, weight, and BMI.  Blood pressure.  Lipid and cholesterol levels. These may be checked every 5 years, or more frequently if you are over 73 years old.  Skin check.  Lung cancer screening. You may have this screening every year starting at age 47 if you have a 30-pack-year history of smoking and currently smoke or have quit within the past 15 years.  Fecal occult blood test (FOBT) of the stool. You may have this test every year starting at age 62.  Flexible sigmoidoscopy or colonoscopy. You may have a sigmoidoscopy every 5 years or a colonoscopy every 10 years starting at age 29.  Hepatitis C blood test.  Hepatitis B blood test.  Sexually transmitted disease (STD) testing.  Diabetes screening. This is done by checking your blood sugar (glucose) after you have not eaten for a while (fasting). You may have this done every 1-3 years.  Mammogram. This may be done every 1-2 years. Talk to your health care provider about when you should start having regular mammograms. This may depend on whether you have a family history of breast cancer.  BRCA-related cancer screening. This may be done if you have a family history of breast, ovarian, tubal, or peritoneal cancers.  Pelvic exam and Pap test. This may be done every 3 years starting at age 72.  Starting at age 50, this may be done every 5 years if you have a Pap test in combination with an HPV test.  Bone density scan. This is done to screen for osteoporosis. You may have this scan if you are at high risk for osteoporosis.  Discuss your test results, treatment options, and if necessary, the need for more tests with your health care  provider. Vaccines Your health care provider may recommend certain vaccines, such as:  Influenza vaccine. This is recommended every year.  Tetanus, diphtheria, and acellular pertussis (Tdap, Td) vaccine. You may need a Td booster every 10 years.  Varicella vaccine. You may need this if you have not been vaccinated.  Zoster vaccine. You may need this after age 14.  Measles, mumps, and rubella (MMR) vaccine. You may need at least one dose of MMR if you were born in 1957 or later. You may also need a second dose.  Pneumococcal 13-valent conjugate (PCV13) vaccine. You may need this if you have certain conditions and were not previously vaccinated.  Pneumococcal polysaccharide (PPSV23) vaccine. You may need one or two doses if you smoke cigarettes or if you have certain conditions.  Meningococcal vaccine. You may need this if you have certain conditions.  Hepatitis A vaccine. You may need this if you have certain conditions or if you travel or work in places where you may be exposed to hepatitis A.  Hepatitis B vaccine. You may need this if you have certain conditions or if you travel or work in places where you may be exposed to hepatitis B.  Haemophilus influenzae type b (Hib) vaccine. You may need this if you have certain conditions.  Talk to your health care provider about which screenings and vaccines you need and how often you need them. This information is not intended to replace advice given to you by your health care provider. Make sure you discuss any questions you have with your health care provider. Document Released: 12/05/2015 Document Revised: 07/28/2016 Document Reviewed: 09/09/2015 Elsevier Interactive Patient Education  [employer].

## 2018-05-02 NOTE — Progress Notes (Signed)
Patient ID: Patricia [facility]inson, female    DOB: 02/04/1971, 47 y.o.   MRN: [id number]  PCP: Harrison Mons, PA-C  Chief Complaint  Patient presents with  . Annual Exam    Subjective:   Presents for [employer].  She did see allergy, and no etiology of the episodic rash on her face was identified. Progesterone still considered a possibility, but she does get the rash between cycles.  Her son has been accepted to [facility] [employer]. They are required to decide by Friday.  Nighttime heat intolerance. Having to adjust the temperature of the bedroom and bedding.  Reports persistent dry cough. History of GERD, but not taking treatment. Has not noticed association with meals. Some allergy type symptoms.  Cervical Cancer Screening:last pap 12/2015, cytology and co-testing both negative. Repeat in 2022. Breast Cancer Screening: mammogram 04/2017.  Colorectal Cancer Screening: precancerous polyp 2016 Bone Density Testing: not yet a candidate HIV Screening: today STI Screening: declines Seasonal Influenza Vaccination: doesn't get these Td/Tdap Vaccination: 04/04/2015 Pneumococcal Vaccination: not yet a candidate Zoster Vaccination: not yet a candidate Frequency of Dental evaluation:Q12 months    Review of Systems Constitutional: Positive for fatigue (slowing for summer). Negative for chills, fever and unexpected weight change.  HENT: Positive for sinus pain (wax and wane with allergies). Negative for congestion, rhinorrhea and sore throat.   Eyes: Negative for visual disturbance.  Respiratory: Positive for cough (dry cough all the time that comes and goes). Negative for chest tightness and shortness of breath.   Cardiovascular: Negative for chest pain, palpitations and leg swelling.  Gastrointestinal: Negative for abdominal pain, blood in stool, constipation, diarrhea, nausea and vomiting.  Endocrine: Positive for heat intolerance (hotter at nighttime - wakes up at  q5am sweating). Negative for cold intolerance.  Genitourinary: Negative for dysuria, frequency, hematuria, menstrual problem, vaginal bleeding, vaginal discharge and vaginal pain.  Musculoskeletal: Positive for arthralgias (ankle soreness). Negative for myalgias.  Skin: Negative for color change and rash.  Neurological: Negative for dizziness, light-headedness, numbness and headaches.  Psychiatric/Behavioral: Negative for agitation and dysphoric mood. The patient is nervous/anxious.     Patient Active Problem List   Diagnosis Date Noted  . Ovarian cyst 06/19/2016  . Headache 04/20/2016  . Numbness and tingling of left leg 06/10/2015  . Colon polyp 02/24/2015  . Gastro-esophageal reflux disease without esophagitis 11/26/2014  . Personal history of other specified conditions 01/18/2013  . History of abnormal mammogram 01/18/2013  . Anxiety state 12/13/2012  . Avitaminosis D 12/12/2012    Past Medical History:  Diagnosis Date  . Anxiety   . Colon polyp   . GERD (gastroesophageal reflux disease)   . Heart murmur   . Hyperlipidemia 36   borderline  . PVCs (premature ventricular contractions)      Prior to Admission medications   Medication Sig Start Date End Date Taking? Authorizing Provider  Ascorbic Acid (VITA-C PO) Take by mouth daily as needed.   Yes [provider]  B Complex-C-E-Zn (VITA ZINC PO) Take by mouth.   Yes [provider]  clonazePAM (KLONOPIN) 0.5 MG tablet Take 0.5 mg by mouth as needed for anxiety.   Yes [provider]  EPINEPHrine 0.15 MG/0.15ML IJ injection Inject 0.15 mLs (0.15 mg total) into the muscle as needed for anaphylaxis. 12/28/17  Yes Xela Oregel, PA-C  L-THEANINE PO Take by mouth.   Yes [provider]  Magnesium 400 MG CAPS Take 400 mg by mouth daily.   Yes  [provider]  Misc Natural Products [facility] COMPOUND PO) Take by mouth daily as needed.   Yes [provider]  TAURINE PO Take by  mouth.   Yes [provider]  VITAMIN D, CHOLECALCIFEROL, PO Take by mouth. 1 drop daily , 2000iu   Yes [provider]  Zinc Acetate, Oral, (ZINC ACETATE PO) Take by mouth daily as needed.   Yes [provider]    No Known Allergies  Past Surgical History:  Procedure Laterality Date  . DILATION AND CURETTAGE OF UTERUS     x 2    Family History  Problem Relation Age of Onset  . Diabetes Father        borderline  . Colon polyps Father   . Arrhythmia Father        V Tach; implanted defibrillator  . ADD / ADHD Son   . Mental illness Son        severe OCD  . Breast cancer Maternal Grandmother        dx in her 1's  . Breast cancer Unknown        cousin, dx in her 52's  . Breast cancer Cousin   . Colon cancer Neg Hx     Social History   Socioeconomic History  . Marital status: Married    Spouse name: Laverna Peace  . Number of children: 1  . Years of education: College  . Highest education level: Not on file  Occupational History  . Occupation: Publishing rights manager  Social Needs  . Financial resource strain: Not on file  . Food insecurity:    Worry: Not on file    Inability: Not on file  . Transportation needs:    Medical: Not on file    Non-medical: Not on file  Tobacco Use  . Smoking status: Never Smoker  . Smokeless tobacco: Never Used  Substance and Sexual Activity  . Alcohol use: Yes    Alcohol/week: 0.0 oz    Comment: occ  . Drug use: No  . Sexual activity: Not on file  Lifestyle  . Physical activity:    Days per week: Not on file    Minutes per session: Not on file  . Stress: Not on file  Relationships  . Social connections:    Talks on phone: Not on file    Gets together: Not on file    Attends religious service: Not on file    Active member of club or organization: Not on file    Attends meetings of clubs or organizations: Not on file    Relationship status: Not on file  Other Topics Concern  . Not on file  Social History  Narrative   Lives with her husband and their son.   A younger son died of unknown cause (?SIDS) at age 83.5 years in 2011.        Objective:  Physical Exam  Constitutional: She is oriented to person, place, and time. Vital signs are normal. She appears well-developed and well-nourished. She is active and cooperative. No distress.  BP 102/80   Pulse 80   Temp 98.1 F (36.7 C)   Resp 16   Ht 5' 3.2" (1.605 m)   Wt 148 lb (67.1 kg)   SpO2 99%   BMI 26.05 kg/m    HENT:  Head: Normocephalic and atraumatic.  Right Ear: Hearing, tympanic membrane, external ear and ear canal normal. No foreign bodies.  Left Ear: Hearing, tympanic membrane, external ear and ear  canal normal. No foreign bodies.  Nose: Nose normal.  Mouth/Throat: Uvula is midline, oropharynx is clear and moist and mucous membranes are normal. No oral lesions. Normal dentition. No dental abscesses or uvula swelling. No oropharyngeal exudate.  Eyes: Pupils are equal, round, and reactive to light. Conjunctivae, EOM and lids are normal. Right eye exhibits no discharge. Left eye exhibits no discharge. No scleral icterus.  Fundoscopic exam:      The right eye shows no arteriolar narrowing, no AV nicking, no exudate, no hemorrhage and no papilledema. The right eye shows red reflex.       The left eye shows no arteriolar narrowing, no AV nicking, no exudate, no hemorrhage and no papilledema. The left eye shows red reflex.  Neck: Trachea normal, normal range of motion and full passive range of motion without pain. Neck supple. No spinous process tenderness and no muscular tenderness present. No thyroid mass and no thyromegaly present.  Cardiovascular: Normal rate, regular rhythm, normal heart sounds, intact distal pulses and normal pulses.  Pulmonary/Chest: Effort normal and breath sounds normal. Right breast exhibits no inverted nipple, no mass, no nipple discharge, no skin change and no tenderness. Left breast exhibits no inverted  nipple, no mass, no nipple discharge, no skin change and no tenderness. Breasts are symmetrical.  Musculoskeletal: She exhibits no edema or tenderness.       Cervical back: Normal.       Thoracic back: Normal.       Lumbar back: Normal.  Lymphadenopathy:       Head (right side): No tonsillar, no preauricular, no posterior auricular and no occipital adenopathy present.       Head (left side): No tonsillar, no preauricular, no posterior auricular and no occipital adenopathy present.    She has no cervical adenopathy.       Right: No supraclavicular adenopathy present.       Left: No supraclavicular adenopathy present.  Neurological: She is alert and oriented to person, place, and time. She has normal strength and normal reflexes. No cranial nerve deficit. She exhibits normal muscle tone. Coordination and gait normal.  Skin: Skin is warm, dry and intact. No rash noted. She is not diaphoretic. No cyanosis or erythema. Nails show no clubbing.  Psychiatric: She has a normal mood and affect. Her speech is normal and behavior is normal. Judgment and thought content normal.     Wt Readings from Last 3 Encounters:  05/02/18 148 lb (67.1 kg)  12/28/17 144 lb 12.8 oz (65.7 kg)  04/29/17 138 lb (62.6 kg)     Visual Acuity Screening   Right eye Left eye Both eyes  Without correction: 20/10 20/13 20/10   With correction:            Assessment & Plan:   Problem List Items Addressed This Visit    BMI 26.0-26.9,adult    Healthy eating, regular exercise.  Adequate sleep.  Adequate oral hydration.      Avitaminosis D    Update labs.      Relevant Orders   VITAMIN D 25 Hydroxy (Vit-D Deficiency, Fractures) (Completed)    Other Visit Diagnoses    Annual physical exam    -  Primary   Age-appropriate health guidance provided.   Screening for deficiency anemia       Relevant Orders   CBC with Differential/Platelet (Completed)   Screening for hyperlipidemia       Relevant Orders   Lipid  panel (Completed)   Screening for metabolic  disorder       Relevant Orders   Comprehensive metabolic panel (Completed)   Screening for thyroid disorder       Relevant Orders   TSH (Completed)   Screening for blood or protein in urine       Relevant Orders   Urinalysis, dipstick only (Completed)   Screening for HIV (human immunodeficiency virus)       Relevant Orders   HIV antibody (Completed)   Cough       Normal exam.  Allergy versus reflux.  Trial of OTC loratadine and/or ranitidine.  If persists, reevaluate.       No follow-ups on file.   Fara Chute, PA-C [facility] [place]

## 2018-05-03 LAB — CBC WITH DIFFERENTIAL/PLATELET
BASOS: 0 %
Basophils Absolute: 0 10*3/uL (ref 0.0–0.2)
EOS (ABSOLUTE): 0.1 10*3/uL (ref 0.0–0.4)
Eos: 2 %
HEMATOCRIT: 41.3 % (ref 34.0–46.6)
Hemoglobin: 13.2 g/dL (ref 11.1–15.9)
IMMATURE GRANS (ABS): 0 10*3/uL (ref 0.0–0.1)
Immature Granulocytes: 0 %
Lymphocytes Absolute: 1.2 10*3/uL (ref 0.7–3.1)
Lymphs: 26 %
MCH: 25.8 pg — ABNORMAL LOW (ref 26.6–33.0)
MCHC: 32 g/dL (ref 31.5–35.7)
MCV: 81 fL (ref 79–97)
Monocytes Absolute: 0.4 10*3/uL (ref 0.1–0.9)
Monocytes: 8 %
Neutrophils Absolute: 2.8 10*3/uL (ref 1.4–7.0)
Neutrophils: 64 %
Platelets: 214 10*3/uL (ref 150–450)
RBC: 5.12 x10E6/uL (ref 3.77–5.28)
RDW: 14.8 % (ref 12.3–15.4)
WBC: 4.5 10*3/uL (ref 3.4–10.8)

## 2018-05-03 LAB — COMPREHENSIVE METABOLIC PANEL
A/G RATIO: 1.7 (ref 1.2–2.2)
ALBUMIN: 4.7 g/dL (ref 3.5–5.5)
ALK PHOS: 47 IU/L (ref 39–117)
ALT: 15 IU/L (ref 0–32)
AST: 20 IU/L (ref 0–40)
BUN / CREAT RATIO: 13 (ref 9–23)
BUN: 8 mg/dL (ref 6–24)
Bilirubin Total: 0.3 mg/dL (ref 0.0–1.2)
CHLORIDE: 103 mmol/L (ref 96–106)
CO2: 18 mmol/L — ABNORMAL LOW (ref 20–29)
Calcium: 9.3 mg/dL (ref 8.7–10.2)
Creatinine, Ser: 0.61 mg/dL (ref 0.57–1.00)
GFR calc non Af Amer: 110 mL/min/{1.73_m2} (ref >59)
GFR, EST AFRICAN AMERICAN: 127 mL/min/{1.73_m2} (ref >59)
GLOBULIN, TOTAL: 2.7 g/dL (ref 1.5–4.5)
GLUCOSE: 95 mg/dL (ref 65–99)
Potassium: 4.6 mmol/L (ref 3.5–5.2)
SODIUM: 141 mmol/L (ref 134–144)
Total Protein: 7.4 g/dL (ref 6.0–8.5)

## 2018-05-03 LAB — LIPID PANEL
Chol/HDL Ratio: 4.3 ratio (ref 0.0–4.4)
Cholesterol, Total: 222 mg/dL — ABNORMAL HIGH (ref 100–199)
HDL: 52 mg/dL (ref >39)
LDL Calculated: 146 mg/dL — ABNORMAL HIGH (ref 0–99)
Triglycerides: 119 mg/dL (ref 0–149)
VLDL Cholesterol Cal: 24 mg/dL (ref 5–40)

## 2018-05-03 LAB — URINALYSIS, DIPSTICK ONLY
BILIRUBIN UA: NEGATIVE
Glucose, UA: NEGATIVE
Ketones, UA: NEGATIVE
LEUKOCYTES UA: NEGATIVE
Nitrite, UA: NEGATIVE
PROTEIN UA: NEGATIVE
RBC UA: NEGATIVE
SPEC GRAV UA: 1.007 (ref 1.005–1.030)
Urobilinogen, Ur: 0.2 mg/dL (ref 0.2–1.0)
pH, UA: 6 (ref 5.0–7.5)

## 2018-05-03 LAB — VITAMIN D 25 HYDROXY (VIT D DEFICIENCY, FRACTURES): Vit D, 25-Hydroxy: 34.1 ng/mL (ref 30.0–100.0)

## 2018-05-03 LAB — TSH: TSH: 1.38 u[IU]/mL (ref 0.450–4.500)

## 2018-05-03 LAB — HIV ANTIBODY (ROUTINE TESTING W REFLEX): HIV SCREEN 4TH GENERATION: NONREACTIVE

## 2018-05-05 NOTE — Assessment & Plan Note (Signed)
Update labs.  

## 2018-05-05 NOTE — Assessment & Plan Note (Signed)
Healthy eating, regular exercise.  Adequate sleep.  Adequate oral hydration.

## 2018-05-19 ENCOUNTER — Other Ambulatory Visit: Payer: Self-pay | Admitting: Physician Assistant

## 2018-05-19 DIAGNOSIS — Z1231 Encounter for screening mammogram for malignant neoplasm of breast: Secondary | ICD-10-CM

## 2018-06-30 ENCOUNTER — Ambulatory Visit
Admission: RE | Admit: 2018-06-30 | Discharge: 2018-06-30 | Disposition: A | Discharge status: Home / Self Care | Payer: BLUE CROSS/BLUE SHIELD | Source: Ambulatory Visit | Attending: Physician Assistant | Admitting: Physician Assistant

## 2018-06-30 DIAGNOSIS — Z1231 Encounter for screening mammogram for malignant neoplasm of breast: Secondary | ICD-10-CM

## 2018-07-03 ENCOUNTER — Other Ambulatory Visit: Payer: Self-pay | Admitting: Physician Assistant

## 2018-07-03 ENCOUNTER — Other Ambulatory Visit: Payer: Self-pay | Admitting: Cardiology

## 2018-07-03 DIAGNOSIS — R928 Other abnormal and inconclusive findings on diagnostic imaging of breast: Secondary | ICD-10-CM

## 2018-07-03 DIAGNOSIS — N6310 Unspecified lump in the right breast, unspecified quadrant: Secondary | ICD-10-CM

## 2018-07-04 ENCOUNTER — Ambulatory Visit
Admission: RE | Admit: 2018-07-04 | Discharge: 2018-07-04 | Disposition: A | Discharge status: Home / Self Care | Payer: BLUE CROSS/BLUE SHIELD | Source: Ambulatory Visit | Attending: Family Medicine | Admitting: Family Medicine

## 2018-07-04 ENCOUNTER — Ambulatory Visit: Payer: BLUE CROSS/BLUE SHIELD

## 2018-07-04 DIAGNOSIS — R928 Other abnormal and inconclusive findings on diagnostic imaging of breast: Secondary | ICD-10-CM

## 2018-07-05 ENCOUNTER — Other Ambulatory Visit: Payer: BLUE CROSS/BLUE SHIELD

## 2020-06-20 IMAGING — MG DIGITAL DIAGNOSTIC UNILATERAL RIGHT MAMMOGRAM WITH TOMO AND CAD
4 series · 4 of 12 positions shown · non-contrast
Comparison: Previous exam(s).

CLINICAL DATA: 47-year-old female for further evaluation of
possible RIGHT breast distortion on screening mammogram..

EXAM:
DIGITAL DIAGNOSTIC UNILATERAL RIGHT MAMMOGRAM WITH CAD AND TOMO

[R MLO synth-2D]
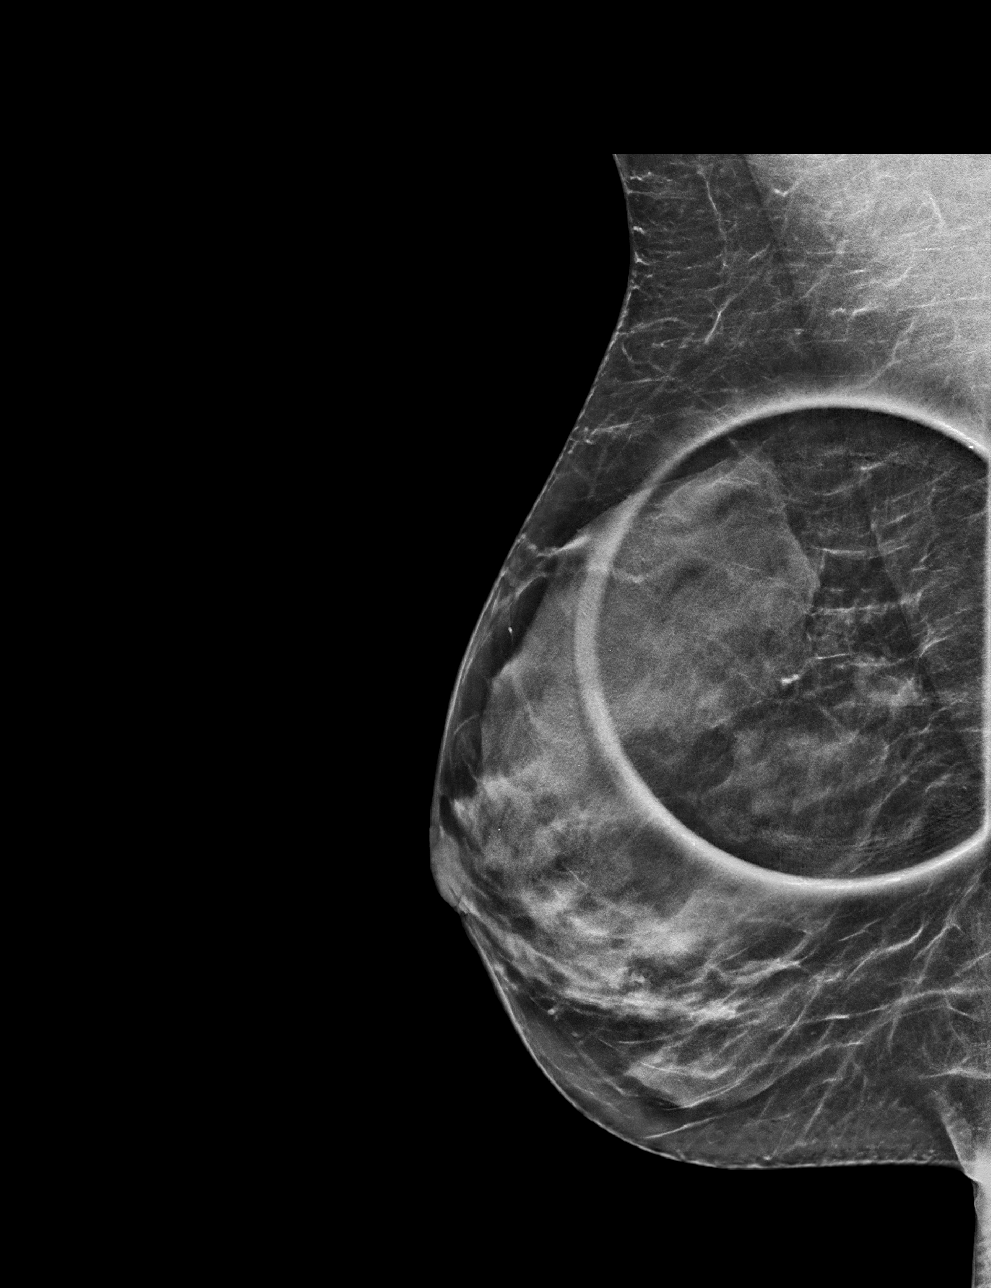

[R ML synth-2D]
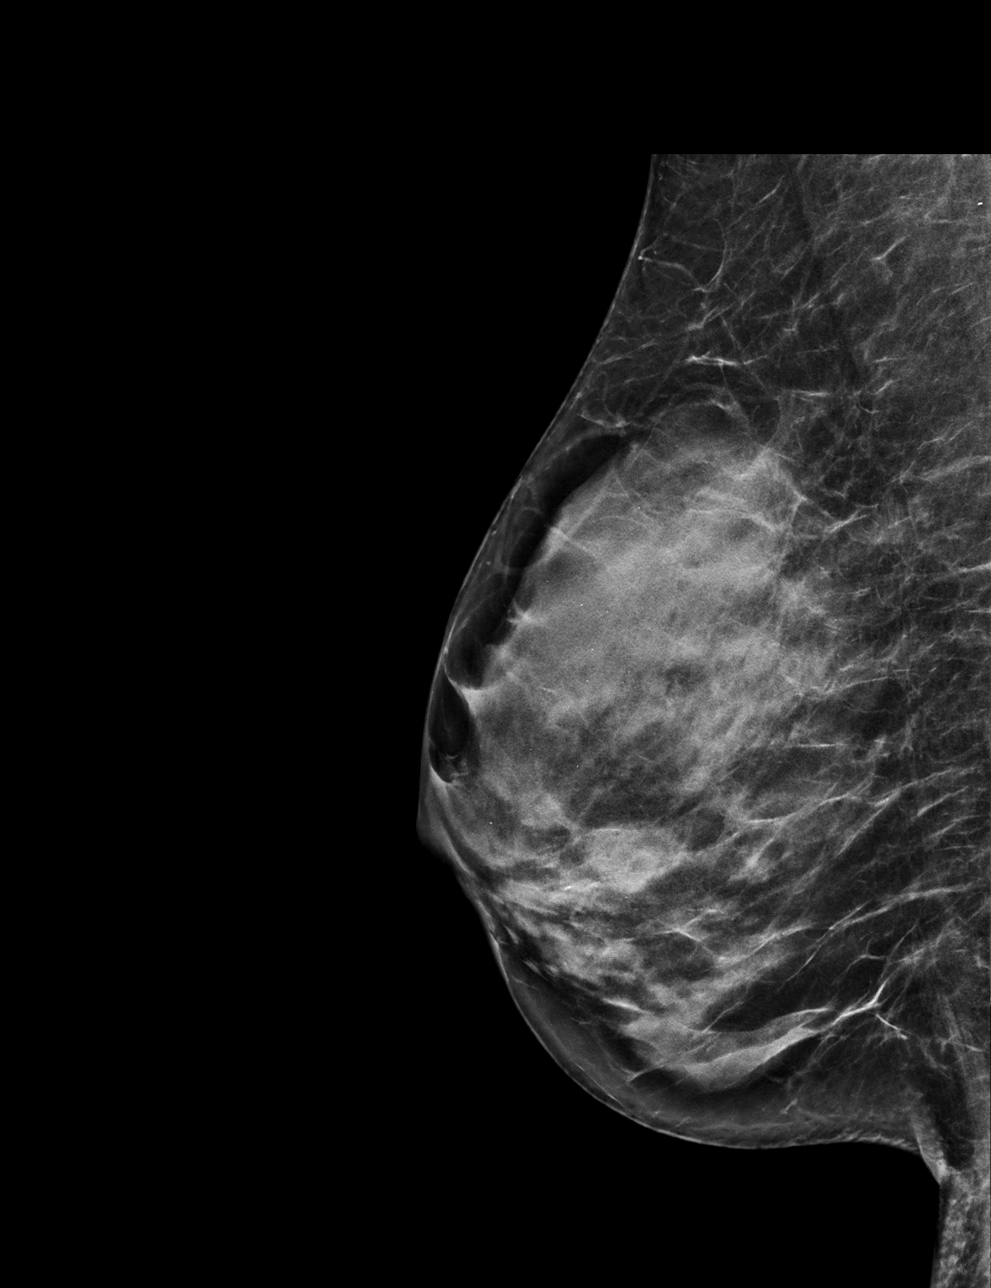

[R MLO tomo · tomo slice 29/56.0]
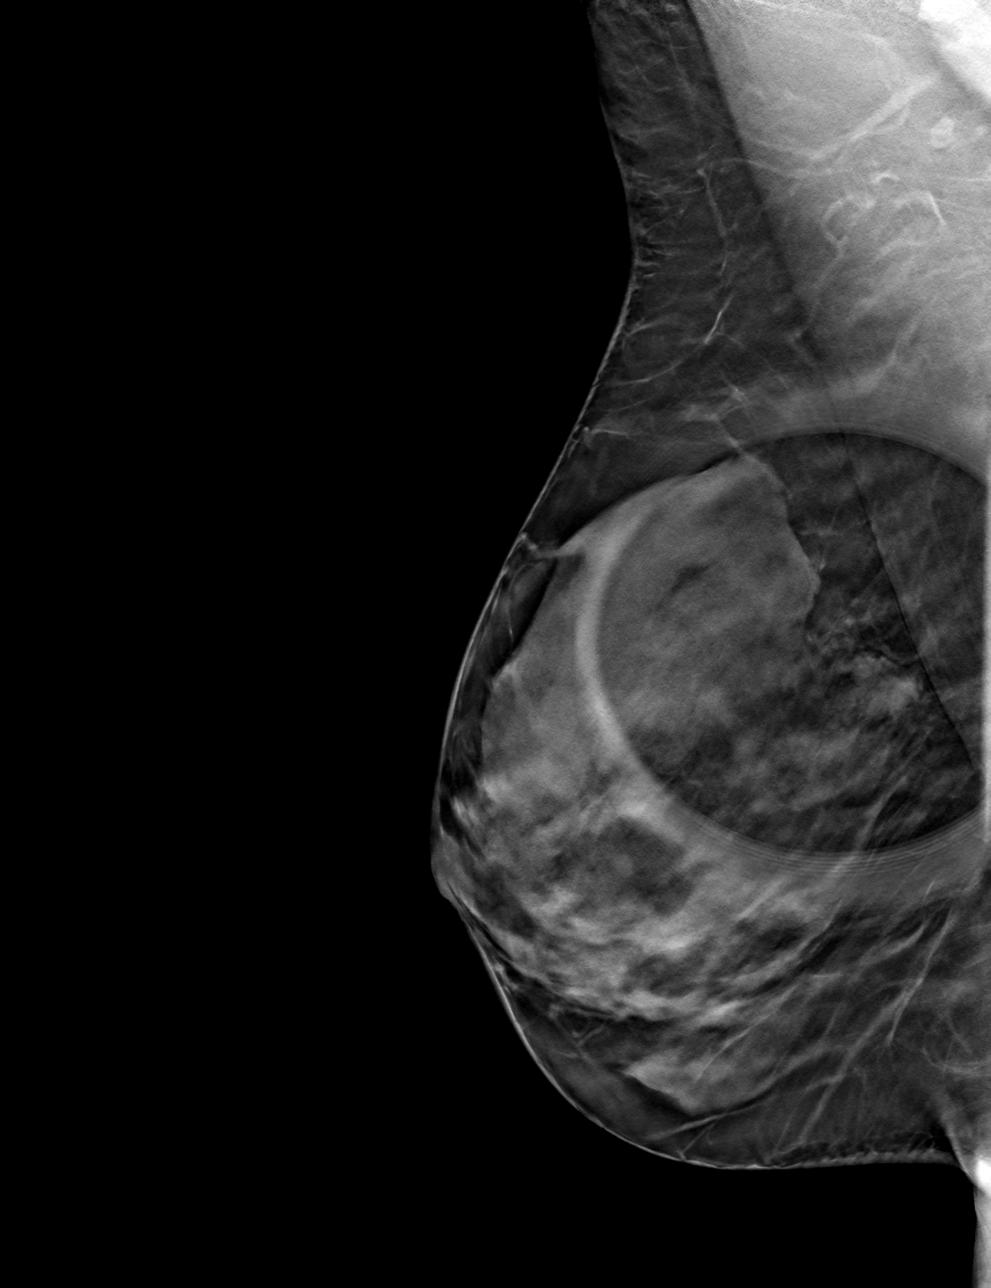

[R ML tomo · tomo slice 29/58.0]
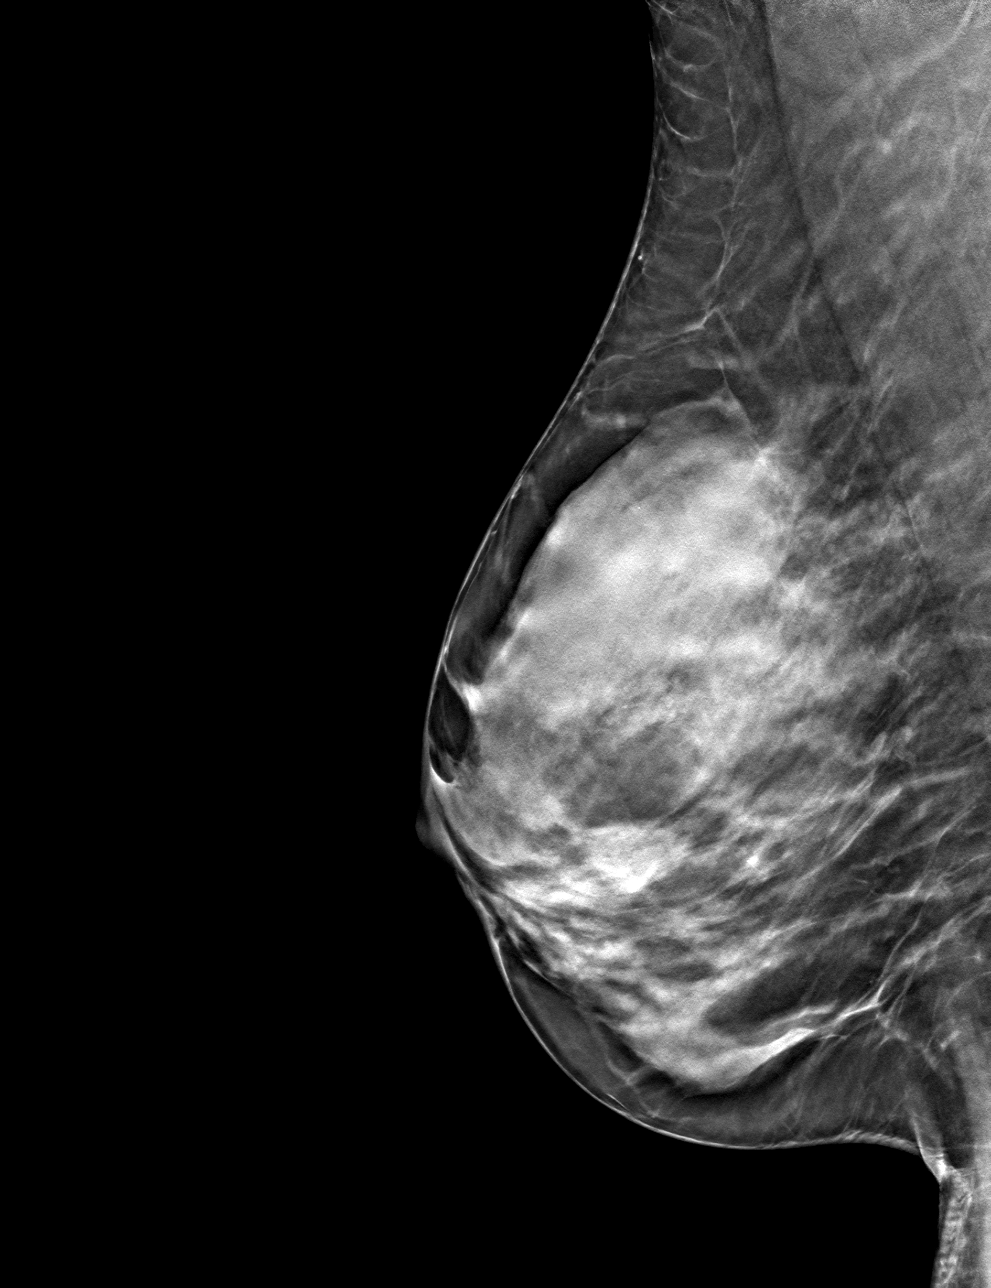

[4 of 12 positions shown; findings below may reference images not displayed]

ACR Breast Density Category c: The breast tissue is heterogeneously
dense, which may obscure small masses.
FINDINGS: 2D/3D full field and spot compression views of the RIGHT breast
demonstrate no persistent distortion in the area of the screening
study finding. No abnormalities are identified.

Mammographic images were processed with CAD.
IMPRESSION: No persistent abnormality in the area of the screening study
finding.

RECOMMENDATION:
Bilateral screening mammogram in 10 months to resume annual
mammogram schedule.

I have discussed the findings and recommendations with the patient.
Results were also provided in writing at the conclusion of the
visit. If applicable, a reminder letter will be sent to the patient
regarding the next appointment.

BI-RADS CATEGORY  1: Negative.

## 2021-02-06 ENCOUNTER — Encounter (HOSPITAL_BASED_OUTPATIENT_CLINIC_OR_DEPARTMENT_OTHER): Payer: Self-pay | Admitting: *Deleted

## 2021-02-06 ENCOUNTER — Emergency Department (HOSPITAL_BASED_OUTPATIENT_CLINIC_OR_DEPARTMENT_OTHER)
Admission: EM | Admit: 2021-02-06 | Discharge: 2021-02-07 | Disposition: A | Discharge status: Home / Self Care | Payer: BLUE CROSS/BLUE SHIELD | Attending: Emergency Medicine | Admitting: Emergency Medicine

## 2021-02-06 ENCOUNTER — Emergency Department (HOSPITAL_BASED_OUTPATIENT_CLINIC_OR_DEPARTMENT_OTHER): Payer: BLUE CROSS/BLUE SHIELD

## 2021-02-06 ENCOUNTER — Other Ambulatory Visit: Payer: Self-pay

## 2021-02-06 DIAGNOSIS — T17900A Unspecified foreign body in respiratory tract, part unspecified causing asphyxiation, initial encounter: Secondary | ICD-10-CM | POA: Insufficient documentation

## 2021-02-06 DIAGNOSIS — X58XXXA Exposure to other specified factors, initial encounter: Secondary | ICD-10-CM | POA: Diagnosis not present

## 2021-02-06 DIAGNOSIS — R0682 Tachypnea, not elsewhere classified: Secondary | ICD-10-CM | POA: Insufficient documentation

## 2021-02-06 DIAGNOSIS — R000 Tachycardia, unspecified: Secondary | ICD-10-CM | POA: Insufficient documentation

## 2021-02-06 DIAGNOSIS — R509 Fever, unspecified: Secondary | ICD-10-CM | POA: Diagnosis not present

## 2021-02-06 DIAGNOSIS — T17908A Unspecified foreign body in respiratory tract, part unspecified causing other injury, initial encounter: Secondary | ICD-10-CM

## 2021-02-06 DIAGNOSIS — R0789 Other chest pain: Secondary | ICD-10-CM | POA: Insufficient documentation

## 2021-02-06 DIAGNOSIS — R059 Cough, unspecified: Secondary | ICD-10-CM

## 2021-02-06 MED ORDER — DOXYCYCLINE HYCLATE 100 MG PO CAPS: 100.0000 mg | ORAL_CAPSULE | Freq: Two times a day (BID) | ORAL | 0 refills | Status: AC

## 2021-02-06 MED ORDER — DOXYCYCLINE HYCLATE 100 MG PO TABS
100.0000 mg | ORAL_TABLET | Freq: Once | ORAL | Status: AC
  Administered 2021-02-06: 100 mg via ORAL
  Filled 2021-02-06: qty 1

## 2021-02-06 NOTE — Discharge Instructions (Signed)
Your history and exam today are consistent with aspiration of the probiotic pill this afternoon.  The x-ray did not show pneumonia and when I spoke to the pulmonologist, they felt that you were appropriate for discharge home with an antibiotic to treat any developing infection.  He did have a fever and were tachycardic likely due to your body's response to the aspiration.  The pulmonologist did not feel that bronchoscopy was needed as it sounded like the medication already dissolved.  Please take the antibiotics for the next 10 days and follow-up with your primary doctor.  Please watch for any new or worsening symptoms including worsening fever, worsening breathing, and general illness.  If any symptoms change or worsen acutely, please return to the nearest emergency department.

## 2021-02-06 NOTE — ED Triage Notes (Signed)
Fever x 2 hours after a cough. She thinks she aspirated a pill. She is anxious on arrival. No difficulty talking.

## 2021-02-06 NOTE — ED Provider Notes (Signed)
[facility] Provider Note   CSN: [id number] Arrival date & time: 02/06/21  2036     History Chief Complaint  Patient presents with  . Fever  . Cough    Patricia Hutch[facility] is a 50 y.o. female.  The history is provided by the patient and medical records. No language interpreter was used.  Cough Cough characteristics:  Productive Sputum characteristics: pieces of capsule. Severity:  Severe Onset quality:  Sudden Timing:  Constant Progression:  Improving Chronicity:  New Relieved by:  Nothing Worsened by:  Nothing Ineffective treatments:  None tried Associated symptoms: fever   Associated symptoms: no chest pain, no chills, no diaphoresis, no headaches, no myalgias, no rash, no rhinorrhea, no shortness of breath, no sinus congestion and no wheezing        Past Medical History:  Diagnosis Date  . Anxiety   . Colon polyp   . GERD (gastroesophageal reflux disease)   . Heart murmur   . Hyperlipidemia 36   borderline  . PVCs (premature ventricular contractions)     Patient Active Problem List   Diagnosis Date Noted  . BMI 26.0-26.9,adult 05/02/2018  . Irregular periods 01/09/2018  . Ovarian cyst 06/19/2016  . Headache 04/20/2016  . Numbness and tingling of left leg 06/10/2015  . Colon polyp 02/24/2015  . Gastro-esophageal reflux disease without esophagitis 11/26/2014  . Personal history of other specified conditions 01/18/2013  . History of abnormal mammogram 01/18/2013  . Anxiety state 12/13/2012  . Avitaminosis D 12/12/2012    Past Surgical History:  Procedure Laterality Date  . DILATION AND CURETTAGE OF UTERUS     x 2     OB History   No obstetric history on file.     Family History  Problem Relation Age of Onset  . Diabetes Father        borderline  . Colon polyps Father   . Arrhythmia Father        V Tach; implanted defibrillator  . ADD / ADHD Son   . Mental illness Son        severe OCD  . Breast cancer Maternal  Grandmother 56  . Breast cancer Cousin        age 58 or 51  . Colon cancer Neg Hx     Social History   Tobacco Use  . Smoking status: Never Smoker  . Smokeless tobacco: Never Used  Substance Use Topics  . Alcohol use: Yes    Alcohol/week: 3.0 standard drinks    Types: 2 Glasses of wine, 1 Cans of beer per week    Comment: occ  . Drug use: No    Home Medications Prior to Admission medications   Medication Sig Start Date End Date Taking? Authorizing Provider  Ascorbic Acid (VITA-C PO) Take by mouth daily as needed.    [provider]  B Complex-C-E-Zn (VITA ZINC PO) Take by mouth.    [provider]  clonazePAM (KLONOPIN) 0.5 MG tablet Take 0.5 mg by mouth as needed for anxiety.    [provider]  EPINEPHrine 0.15 MG/0.15ML IJ injection Inject 0.15 mLs (0.15 mg total) into the muscle as needed for anaphylaxis. 12/28/17   Harrison Mons, PA  L-THEANINE PO Take by mouth.    [provider]  Magnesium 400 MG CAPS Take 400 mg by mouth daily.    [provider]  Misc Natural Products [facility] COMPOUND PO) Take by mouth daily as needed.    [provider]  TAURINE PO Take by mouth.    [provider]  VITAMIN D, CHOLECALCIFEROL, PO Take by mouth. 1 drop daily , 2000iu    [provider]  Zinc Acetate, Oral, (ZINC ACETATE PO) Take by mouth daily as needed.    [provider]    Allergies    Patient has no known allergies.  Review of Systems   Review of Systems  Constitutional: Positive for fever. Negative for chills, diaphoresis and fatigue.  HENT: Negative for congestion and rhinorrhea.   Respiratory: Positive for cough and choking. Negative for chest tightness, shortness of breath and wheezing.   Cardiovascular: Negative for chest pain, palpitations and leg swelling.  Gastrointestinal: Negative for abdominal pain.  Genitourinary: Negative for dysuria.  Musculoskeletal: Negative for back pain,  myalgias, neck pain and neck stiffness.  Skin: Negative for rash and wound.  Neurological: Negative for dizziness, light-headedness and headaches.  Psychiatric/Behavioral: Negative for agitation.  All other systems reviewed and are negative.   Physical Exam Updated Vital Signs BP 140/90   Pulse (!) 123   Temp 100.2 F (37.9 C) (Oral)   Resp (!) 29   Ht 5\' 3"  (1.6 m)   Wt 67.4 kg   LMP 02/04/2021   SpO2 97%   BMI 26.34 kg/m   Physical Exam Vitals and nursing note reviewed.  Constitutional:      General: She is not in acute distress.    Appearance: She is well-developed. She is not ill-appearing, toxic-appearing or diaphoretic.  HENT:     Head: Normocephalic and atraumatic.     Nose: Nose normal. No congestion or rhinorrhea.     Mouth/Throat:     Mouth: Mucous membranes are moist.     Pharynx: No oropharyngeal exudate or posterior oropharyngeal erythema.  Eyes:     Extraocular Movements: Extraocular movements intact.     Conjunctiva/sclera: Conjunctivae normal.     Pupils: Pupils are equal, round, and reactive to light.  Cardiovascular:     Rate and Rhythm: Regular rhythm. Tachycardia present.     Heart sounds: No murmur heard.   Pulmonary:     Effort: Pulmonary effort is normal. Tachypnea present. No respiratory distress.     Breath sounds: Normal breath sounds. No stridor. No wheezing, rhonchi or rales.  Chest:     Chest wall: No tenderness.  Abdominal:     General: Abdomen is flat.     Palpations: Abdomen is soft.     Tenderness: There is no abdominal tenderness. There is no right CVA tenderness, left CVA tenderness, guarding or rebound.  Musculoskeletal:        General: No tenderness.     Cervical back: Neck supple. No tenderness.  Skin:    General: Skin is warm and dry.     Capillary Refill: Capillary refill takes less than 2 seconds.  Neurological:     Mental Status: She is alert.  Psychiatric:        Mood and Affect: Mood is anxious.     ED Results  / Procedures / Treatments   Labs (all labs ordered are listed, but only abnormal results are displayed) Labs Reviewed - No data to display  EKG EKG Interpretation  Date/Time:  Friday February 06 2021 20:49:58 EDT Ventricular Rate:  136 PR Interval:    QRS Duration: 84 QT Interval:  366 QTC Calculation: 550 R Axis:   7 Text Interpretation: Critical Test Result: Long QTc Sinus tachycardia Low voltage QRS Possible Inferior infarct , age  undetermined Abnormal ECG No prior ECG for comparison. no STEMI Confirmed by Antony Blackbird [phone]) on 02/06/2021 8:59:16 PM   Radiology DG Chest 2 View  Result Date: 02/06/2021 CLINICAL DATA:  Possible aspiration. EXAM: CHEST - 2 VIEW COMPARISON:  None. FINDINGS: The heart size and mediastinal contours are within normal limits. Both lungs are clear. The visualized skeletal structures are unremarkable. IMPRESSION: No active cardiopulmonary disease. Electronically Signed   By: Dorise Bullion III M.D   On: 02/06/2021 21:35    Procedures Procedures   Medications Ordered in ED Medications  doxycycline (VIBRA-TABS) tablet 100 mg (has no administration in time range)    ED Course  I have reviewed the triage vital signs and the nursing notes.  Pertinent labs & imaging results that were available during my care of the patient were reviewed by me and considered in my medical decision making (see chart for details).    MDM Rules/Calculators/A&P                          [facility] is a 50 y.o. female with a past medical history significant for GERD, anxiety, and hyperlipidemia who presents with aspiration and choking of a probiotic tablet, coughing, and fever.  Patient reports that she was feeling completely at her baseline and normal today when she took a probiotic capsule of Culturelle digestive daily probiotic.  She reports this is a lactobacillus pill.  She started taking it yesterday and this was her second dose.  She says that when she was drinking  it, she choked and felt like she aspirated the pill.  She had immediate onset of coughing and discomfort in her chest.  She reports that this lasted for several hours on and off.  She coughed up some pieces of the capsule and coughed up some of the powder that was inside it.  She reports a foul taste in her mouth from this.  She reports no nausea or vomiting.  Over the neck several hours, she developed a fever and continued to have coughing.  She decided to get evaluated.  Of note, she had no recent Covid exposures or any symptoms before this choking episode.  On exam, patient is coughing.  She denies any wheezing or abnormalities on auscultation of the lungs.  No stridor.  Chest and abdomen nontender.  Patient tachycardic in the 130s on arrival and she has a temperature of just over 100.  Patient had EKG showing sinus tachycardia.  She did have a prolonged QTC initially but on reassessment from telemetry strip it appears to improve when her heart rate slows down.  No STEMI.  X-ray was obtained showing no evidence of pneumonia seen.  No acute abnormalities including no pneumothorax or other changes.  Due to this lactobacillus aspiration, I called the critical care/pulmonology who reviewed the case.  After looking at the images themselves, they feel she is appropriate and safe for discharge home with antibiotics to treat prophylactically for any aspirated bacteria.  They recommended Augmentin however patient reports that her son has anaphylactic allergy to this and she was told to avoid it at one time.  I spoke with pharmacy who recommended doxycycline which patient was given a dose of.  Heart rate continues to improve and heart rate was around 100 on my reassessment.  She is appearing well and is less coughing episodes.  Patient will follow up with PCP and will take doxycycline.  She understands return precautions for  any new or worsened symptoms.  She had no other questions or concerns and was  discharged in good condition.   Final Clinical Impression(s) / ED Diagnoses Final diagnoses:  Aspiration into airway, initial encounter  Cough  Fever, unspecified fever cause    Rx / DC Orders ED Discharge Orders         Ordered    doxycycline (VIBRAMYCIN) 100 MG capsule  2 times daily        02/06/21 2327          Clinical Impression: 1. Aspiration into airway, initial encounter   2. Cough   3. Fever, unspecified fever cause     Disposition: Discharge  Condition: Good  I have discussed the results, Dx and Tx plan with the pt(& family if present). He/she/they expressed understanding and agree(s) with the plan. Discharge instructions discussed at great length. Strict return precautions discussed and pt &/or family have verbalized understanding of the instructions. No further questions at time of discharge.    New Prescriptions   DOXYCYCLINE (VIBRAMYCIN) 100 MG CAPSULE    Take 1 capsule (100 mg total) by mouth 2 (two) times daily for 10 days.    Follow Up: Berkley Harvey[place] NP [place] [id number] [place] [facility] [address] [id number] EG [facility] [phone]       Tegeler, Gwenyth Allegra, MD 02/06/21 [phone]

## 2023-01-24 IMAGING — DX DG CHEST 2V
2 series · 2 of 2 positions shown · non-contrast
Comparison: None.

CLINICAL DATA: Possible aspiration.

EXAM:
CHEST - 2 VIEW

[chest pa]
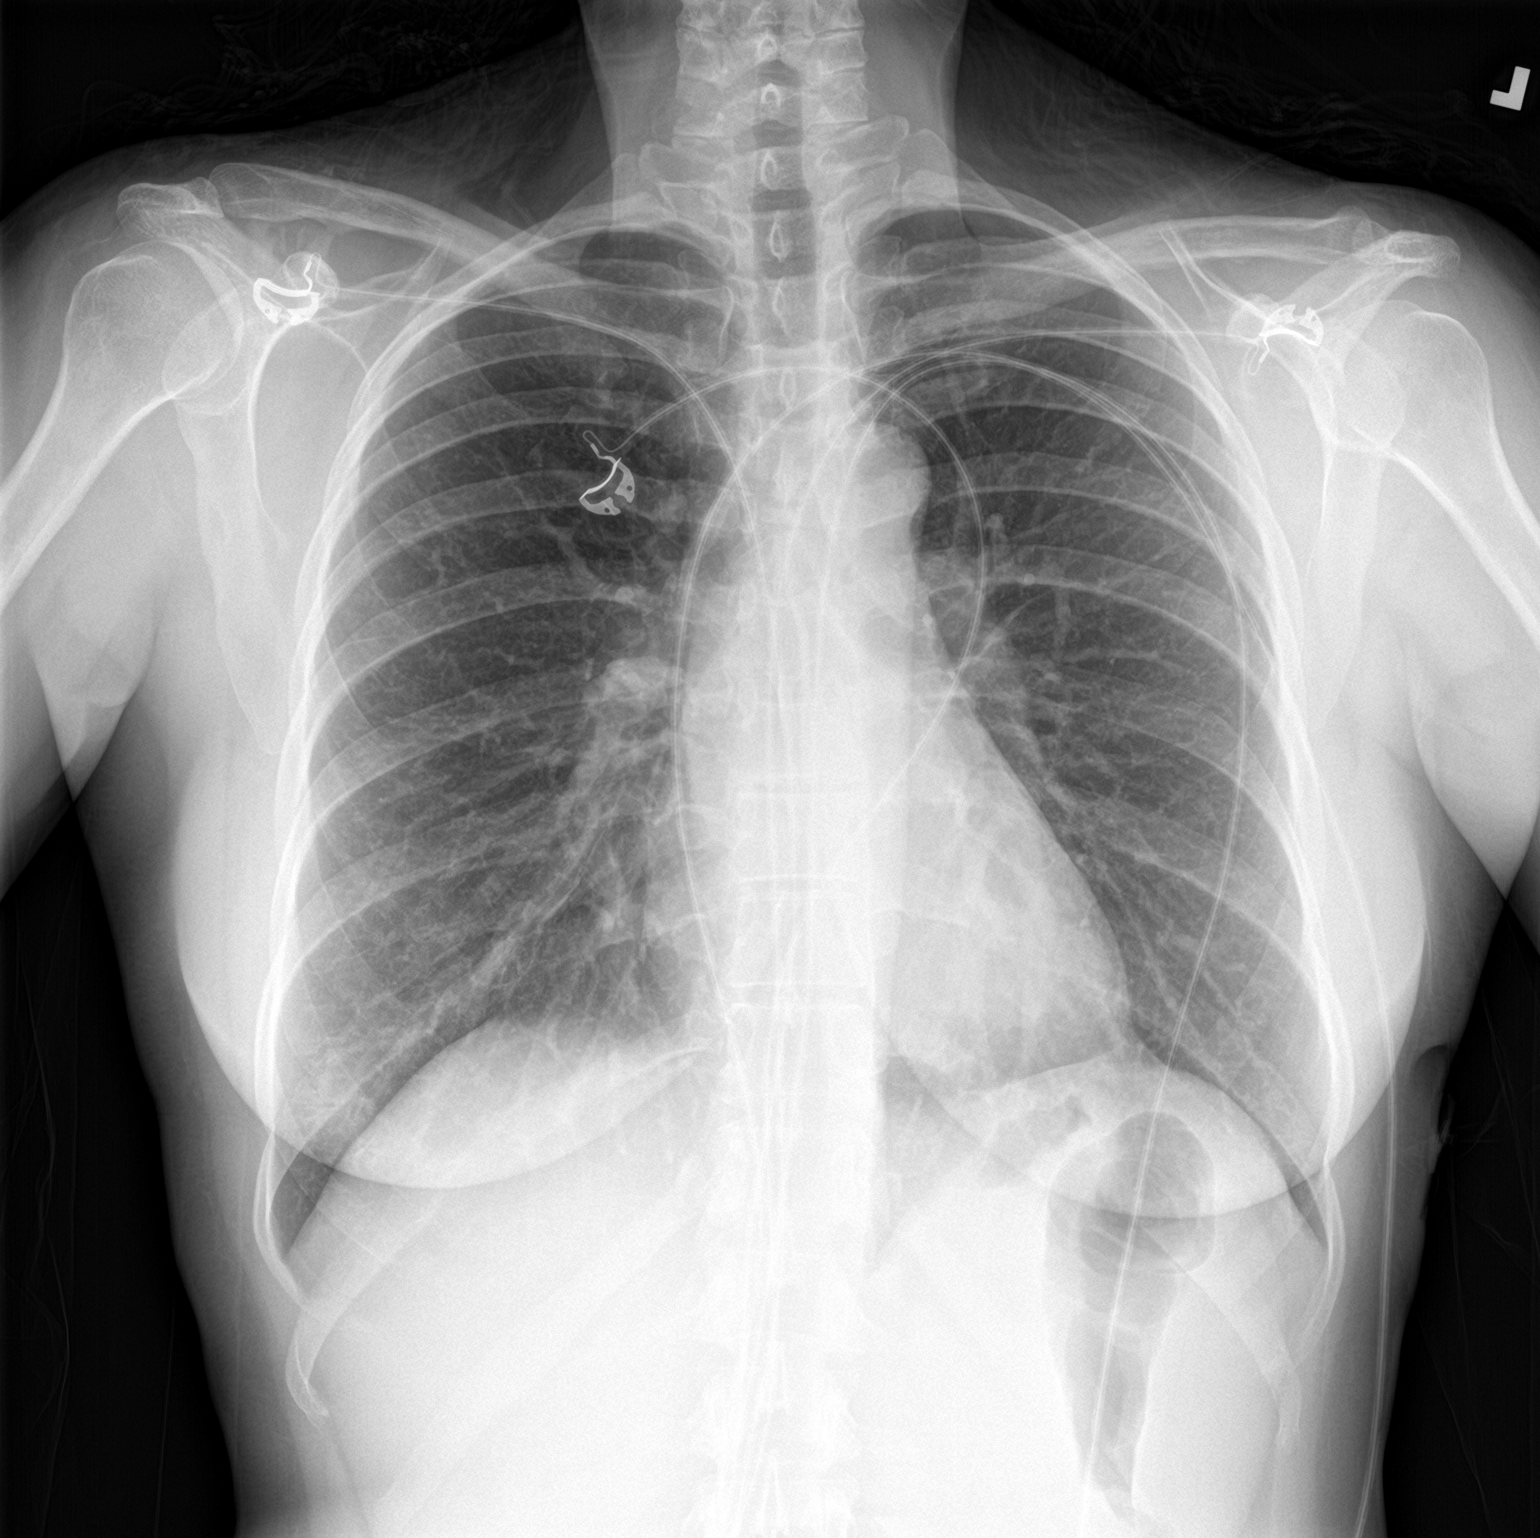

[chest lat]
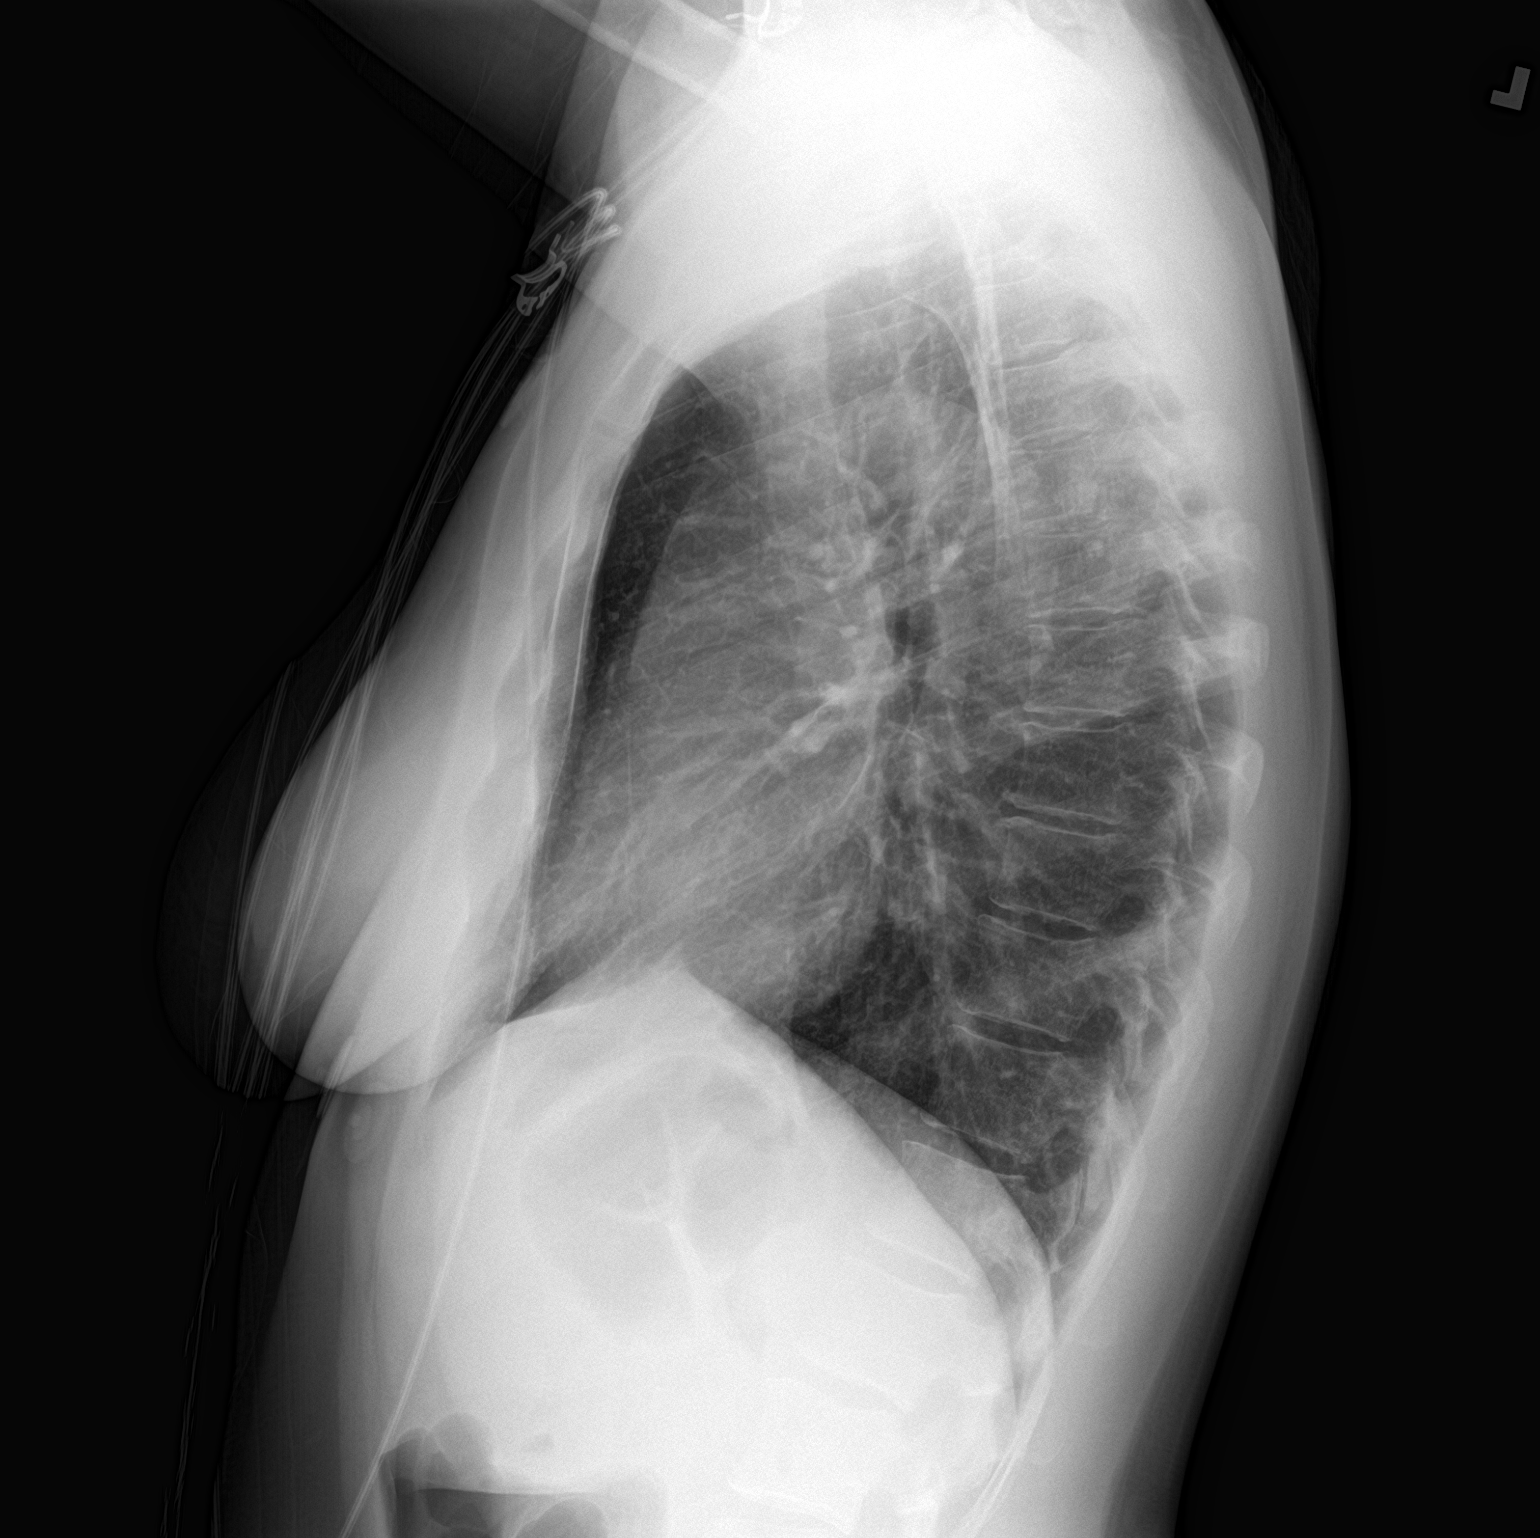

[2 of 2 positions shown; findings below may reference images not displayed]

FINDINGS: The heart size and mediastinal contours are within normal limits.
Both lungs are clear. The visualized skeletal structures are
unremarkable.
IMPRESSION: No active cardiopulmonary disease.
